# Patient Record
Sex: Male | Born: 1935 | Race: White | Hispanic: No | Smoking: Never smoker
Health system: Southern US, Community
[De-identification: ages and names within clinical notes are randomized; demographics above are authoritative.]

## PROBLEM LIST (undated history)

## (undated) DIAGNOSIS — I10 Essential (primary) hypertension: Secondary | ICD-10-CM

## (undated) HISTORY — PX: TOTAL HIP ARTHROPLASTY: SHX124

---

## 2001-09-27 ENCOUNTER — Encounter: Payer: Self-pay | Admitting: Urology

## 2001-09-27 ENCOUNTER — Ambulatory Visit (HOSPITAL_BASED_OUTPATIENT_CLINIC_OR_DEPARTMENT_OTHER): Admission: RE | Admit: 2001-09-27 | Discharge: 2001-09-27 | Payer: Self-pay | Admitting: Urology

## 2002-07-15 ENCOUNTER — Encounter: Payer: Self-pay | Admitting: Gastroenterology

## 2002-07-15 ENCOUNTER — Encounter: Admission: RE | Admit: 2002-07-15 | Discharge: 2002-07-15 | Payer: Self-pay | Admitting: Gastroenterology

## 2006-03-15 ENCOUNTER — Inpatient Hospital Stay (HOSPITAL_COMMUNITY): Admission: EM | Admit: 2006-03-15 | Discharge: 2006-03-19 | Payer: Self-pay | Admitting: *Deleted

## 2006-03-18 ENCOUNTER — Ambulatory Visit: Payer: Self-pay | Admitting: Physical Medicine & Rehabilitation

## 2006-03-19 ENCOUNTER — Inpatient Hospital Stay (HOSPITAL_COMMUNITY)
Admission: RE | Admit: 2006-03-19 | Discharge: 2006-03-25 | Payer: Self-pay | Admitting: Physical Medicine & Rehabilitation

## 2009-01-28 ENCOUNTER — Emergency Department (HOSPITAL_COMMUNITY): Admission: EM | Admit: 2009-01-28 | Discharge: 2009-01-28 | Payer: Self-pay | Admitting: Emergency Medicine

## 2010-06-07 NOTE — Discharge Summary (Signed)
NAMEDONZEL, ROMACK               ACCOUNT NO.:  [id number]   MEDICAL RECORD NO.:  [id number]          PATIENT TYPE:  IPS   LOCATION:  4001                         FACILITY:  [facility]   PHYSICIAN:  Ellwood Dense, M.D.   DATE OF BIRTH:  May 12, 1931   DATE OF ADMISSION:  03/19/2006  DATE OF DISCHARGE:  03/25/2006                               DISCHARGE SUMMARY   DISCHARGE DIAGNOSES:  1. Right hip basal cervical and subtrochanteric fracture with open      reduction internal fixation of subtrochanteric fracture.  2. Hypertension.  3. Acute blood loss anemia.  4. Hyponatremia resolved.  5. Hyperkalemia resolved.  6. Thrombocytopenia resolved.   HISTORY OF PRESENT ILLNESS:  Mr. Attridge is a 77 year old male with  history of hypertension and fall on February 24, with right hip basal  cervical and subtrochanteric fracture.  Evaluated by Dr. Kennon Rounds, and the  patient underwent right hip IM nailing of subtrochanteric fracture on  February 25.  Postop is touchdown weightbearing and on Coumadin for DVT  prophylaxis.  Therapies initiated and patient noted to have issues with  safety with walker use, is showing improvement with touch down  weightbearing status.  Noted to also have issues with hypokalemia with  potassium at 3.4, hyponatremia with sodium 130, and thrombocytopenia  with platelets at 119 currently.  The patient also with impairments in  mobility and self-care.  Rehab consulted for further therapies.   PAST MEDICAL HISTORY:  Significant for:  1. Hypertension.  2. Lumbar stenosis L4-S1.  3. History of phimosis, benign, treated with circumcision.   ALLERGIES:  NO KNOWN DRUG ALLERGIES.   FAMILY HISTORY:  Noncontributory.   SOCIAL HISTORY:  The patient is married.  Lives with wife and son in a  one-level home with step at entry.  Self-employed in the [employer].  Does not use any tobacco or alcohol use.   HOSPITAL COURSE:  Mr. Cassidy Tabet was admitted to rehab on  March 19, 2006.  The patient therapies to consist of PT and OT today.  Supplement added for his postop anemia.  Recheck CBC of 129 shows  hemoglobin 9.8, hematocrit 27.8, white count 6.9, platelets much  improved at 216.  Check of lytes revealed hyponatremia, hypokalemia did  resolve.  Sodium 136, potassium 3.5, chloride 94, CO2 32, BUN 18,  creatinine 0.91, glucose 112.  Hemoglobin A1c check was noted to be  borderline high at 6.3.  The patient was maintained on Coumadin for DVT  prophylaxis.  anion is therapeutic at discharge at 3.1 and the patient  was discharged on 3 mg of Coumadin a day.  Next pro time to be checked  by home health nurse on March 7, with Coumadin to continue through March  25, to complete DVT prophylaxis.  Pain control has been managed with  p.r.n. use of Ultram.  The patient's right hip incision was noted to be  healing well without any signs or symptoms of infection.  Moderate edema  noted of right thigh is somewhat improved.  Staples were discontinued in  areas and steri-stripped on March 24, 2006,  without difficulty.  The  patient has had issues with constipation and was started on Senna-S with  improvement in his symptomatology.  Therapies initiated and patient has  progressed along to being at min assist for bathing and dressing, min  assist for toileting.  He is at modified independent level for  transfers.  Supervision level for car transfers.  Modified independent  for standing.  Modified independent for ambulating 150 feet, at touch-  down weight bearing.  Supervision to navigate 1 stair.  Further follow-  up therapies to include home health, PT, OT by [facility].  Home health RN to draw protime with [facility] to  manage and adjust Coumadin past discharge.  On March 25, 2006, the  patient is discharged to home.   DISCHARGE MEDICATIONS:  1. Coumadin 2 mg p.o. q.p.m.  2. Senokot-S 2 p.o. nightly.  3. Ferrous sulfate 325 mg  b.i.d.  4. K-Dur 20 mEq a day.  5. Hyzaar 100/25 per day.  6. Ultram 50 mg every 6 to 8 hours p.r.n. pain.  7. Tylenol as needed for pain.   DIET:  Regular.   ACTIVITY:  As tolerated.  Touchdown weightbearing right lower extremity.  Use a walker for ambulation under supervision.   SPECIAL INSTRUCTIONS:  Resume aspirin after April 14, 2006.   FOLLOWUP:  The patient to followup with Dr. Kennon Rounds for postop check in 2  weeks.  Followup with Dr. Sherin Quarry on March 10 at 4:15.  Followup with  Dr. Ellwood Dense as needed.      Greg Cutter, P.A.    ______________________________  Ellwood Dense, M.D.    PP/MEDQ  D:  03/25/2006  T:  03/26/2006  Job:  [id number]   cc:   Marletta Lor, M.D.

## 2010-06-07 NOTE — Discharge Summary (Signed)
[name], Daniel Mathis               ACCOUNT NO.:  [id number]   MEDICAL RECORD NO.:  [id number]          PATIENT TYPE:  INP   LOCATION:  5007                         FACILITY:  [facility]   PHYSICIAN:  Doralee Albino. Carola Frost, M.D. DATE OF BIRTH:  [birth year]   DATE OF ADMISSION:  03/15/2006  DATE OF DISCHARGE:  03/19/2006                               DISCHARGE SUMMARY   DISCHARGE DIAGNOSES:  Right hip basocervical and subtroc femoral  fracture.   ADDITIONAL DIAGNOSIS:  Hypertension.   BRIEF SUMMARY OF HOSPITAL COURSE:  [name] was admitted on  March 15, 2006 and at that time he underwent IM nailing of his right  hip and subtroc fracture using a gamma 3 nail.  He tolerated it quite  well and postoperatively was touchdown weightbearing with physical  therapy.  He was on Coumadin for DVT prophylaxis, he progressed well and  by postop day four was deemed stable for discharge to rehab.  I did feel  that we should go ahead and get a CT scan, as he seemed a little bit  somnolent and I wanted to make sure he did not have an occult subdural,  particularly as he was on Coumadin.  The CT scan was normal as were labs  also obtained to evaluate for possible metabolic reasons for decreased  overall level of consciousness, all this was fine.  The patient also  became more alert as well.   DISCHARGE INSTRUCTIONS:  [name] is to be touchdown weightbearing,  continue Coumadin for DVT prophylaxis, return to see Dr. Carola Frost in two  weeks.  Dressing can be changed daily.      Doralee Albino. Carola Frost, M.D.  Electronically Signed     MHH/MEDQ  D:  05/07/2006  T:  05/07/2006  Job:  [id number]

## 2010-06-07 NOTE — H&P (Signed)
Daniel Mathis, Daniel Mathis               ACCOUNT NO.:  [id number]   MEDICAL RECORD NO.:  [id number]          PATIENT TYPE:  IPS   LOCATION:  NA                           FACILITY:  [facility]   PHYSICIAN:  Ellwood Dense, M.D.   DATE OF BIRTH:  [birth year]   DATE OF ADMISSION:  DATE OF DISCHARGE:                              HISTORY & PHYSICAL   Primary care physician:  Dr. Barnett Abu.  Trauma doctor:  Doralee Albino. Carola Frost, M.D.  Urologist:  Valetta Fuller, M.D.   HISTORY OF PRESENT ILLNESS:  Daniel Mathis is a 77 year old adult male  with history of hypertension.  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  He was evaluated in the emergency room by  Dr. Carola Frost and underwent a right hip intramedullary nailing March 16, 2006.  Postoperatively he was allowed touchdown weightbearing and is on  Coumadin for DVT prophylaxis with an INR of 3.1 today.  Therapies have  been initiated.  The patient requires cues for safety using a walker.  He has been slightly better in therapy today.   The patient was evaluated by the rehabilitation physicians and felt to  be an appropriate candidate for inpatient rehabilitation.   REVIEW OF SYSTEMS:  Positive for edema of the right leg, wound healing,  and weakness.   PAST MEDICAL HISTORY:  1. Hypertension.  2. Lumbar stenosis at L4-S1.  3. History of phimosis/balanitis, treated with circumcision.   FAMILY HISTORY:  Noncontributory.   SOCIAL HISTORY:  The patient lives with his wife and son.  The wife  requires some assistance and the son provides that.  The son has  undergone multiple surgeries of his pancreas for diabetes and  essentially is only able to care for his mother.  He does not work  outside the home.  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  He reports rare alcohol  intake and denies tobacco usage.   FUNCTIONAL HISTORY PRIOR TO ADMISSION:  Independent and working.   ALLERGIES:  No known drug allergies.   MEDICATIONS PRIOR TO ADMISSION:  1. Cozaar/hydrochlorothiazide 125/12.5 mg, questionable one tablet      daily.  2. Aspirin 81 mg daily.   LABORATORY DATA:  Most recent hemoglobin was 9.8 with a hematocrit of  28.1, platelet count of 109,000, and white count 9.4.  Recent sodium was  130, potassium 3.4, chloride 93, CO2 28, BUN 9, and creatinine 0.8 with  glucose of 184.  Chest x-ray March 15, 2006, showed no active disease  with vague density of the left midlung.  Follow-up PA view was  recommended.   PHYSICAL EXAMINATION:  GENERAL:  A well-appearing adult male lying in a  recliner in mild to no acute discomfort.  VITAL SIGNS:  Blood pressure 128/68 with pulse 76, respiratory rate 18  and temperature 97.2.  HEENT:  Normocephalic, atraumatic.  CARDIOVASCULAR:  Regular rate and rhythm, S1, S2, without murmurs.  ABDOMEN:  Soft, nontender, with positive bowel sounds.  LUNGS:  Clear to auscultation bilaterally.  NEUROLOGIC:  Alert and oriented x3.  Cranial nerves II-XII are intact.  Bilateral upper extremity exam showed 5/5 strength throughout.  Bulk and  tone were normal and reflexes were 2+ and symmetric.  Left lower  extremity exam showed 4+ to 5-/5 strength throughout.  Right lower  extremity exam showed well-healing wound of the proximal right hip.   IMPRESSION:  Status post fall with subtrochanteric hip fracture on the  right, status post intramedullary nailing with touchdown weightbearing  allowed.  Coumadin for DVT prophylaxis.   Presently the patient has deficits in ADLs, transfers and ambulation  secondary to the right subtrochanteric/basocervical hip fracture, status  post nailing.   PLAN:  Admit to the rehabilitation unit for daily therapies to include:  1. PT for range of motion, strengthening, bed mobility, transfers, pre-       gait training, gait training, and equipment evaluation.  2. Occupational therapy for range of motion, strengthening, ADLs,      cognitive/perceptual training, splinting and equipment evaluation.  3. Rehab nursing for skin care, wound care, and bowel and bladder      training.  4. Case management to assess home environment and assist with      discharge planning and arrange for appropriate follow-up care.  5. Social worker to assess family and social support, counsel the      patient regarding disability issues, and assist in discharge      planning.  6. Check admission labs including CBC and CMET Friday, March 20, 2006.  7. Coumadin per pharmacy.  8. Monitor hypertension on hydrochlorothiazide 25 mg p.o. daily and      Cozaar 100 mg p.o. daily.  9. Protonix 40 mg p.o. daily.  10.Check hemoglobin A1c with morning labs.  11.Check a PA and lateral chest x-ray to follow up for left midlung      density.  12.Iron sulfate 325 mg p.o. b.i.d. for postoperative anemia.  13.Wean O2 to off using 1-2 L per nasal cannula to keep saturations      greater than or equal to 90%.  14.Potassium chloride 20 mEq p.o. daily and daily.  15.Dulcolax suppository daily p.r.n. and sorbitol 30 mL p.o. q.12h.      p.r.n. for constipation.  16.Oxycodone 5 mg one to two tablets p.o. q.4h. for pain relief.   PROGNOSIS:  Good.   ESTIMATED LENGTH OF STAY:  4-7 days.   GOALS:  Modified independent ADLs, transfers and ambulation.           ______________________________  Ellwood Dense, M.D.     DC/MEDQ  D:  03/19/2006  T:  03/19/2006  Job:  [id number]

## 2010-06-07 NOTE — Op Note (Signed)
   Daniel Mathis, Daniel Mathis                        ACCOUNT NO.:  [id number]   MEDICAL RECORD NO.:  [id number]                   PATIENT TYPE:  AMB   LOCATION:  [facility]                                 FACILITY:  [facility]   PHYSICIAN:  Valetta Fuller, M.D.               DATE OF BIRTH:  01/18/1932   DATE OF PROCEDURE:  09/27/2001  DATE OF DISCHARGE:                                 OPERATIVE REPORT   PREOPERATIVE DIAGNOSES:  1. Severe phimosis.  2. Chronic balanitis.   POSTOPERATIVE DIAGNOSES:  1. Severe phimosis.  2. Chronic balanitis.   PROCEDURE PERFORMED:  Circumcision.   SURGEON:  Valetta Fuller, M.D.   ANESTHESIA:  IV sedation with penile block.   INDICATIONS:  The patient has been unable to retract his foreskin.  On exam,  he had fairly severe phimosis.  It was difficult to assess the glans penis,  but there did appear to be some evidence of chronic balanitis.  We  recommended strong consideration for circumcision.  The patient was informed  of the advantages, disadvantages, and risks of the procedure and appeared to  understand these issues.  Full informed consent was obtained.   TECHNIQUE AND FINDINGS:  The patient was brought to the operating room.  He  had some IV sedation, and we then performed a penile block with a  combination of Marcaine and lidocaine.  Again, we were unable to retract his  foreskin.  A circumferential incision was made behind the glans penis after  prepping and draping the patient in the normal manner.  We then performed a  dorsal slit in order to retract the foreskin.  The patient had a tremendous  amount of some chronic inflammation on the glans penis along with some  squamous epithelial cell debris.  That was all prepped and cleanses.  A  second circumferential incision was then made in the mucosal collar, and the  redundant, thickened, and inflamed foreskin was removed.  The skin edges  were reapproximated with interrupted 4-0 suture, and a very  light pressure  dressing was applied with Vaseline gauze, Bacitracin, and some Coban.  The  patient appeared to tolerate the procedure well.  We noted at the end that  the patient had fairly severe meatal stenosis, and we have been unable to  evaluate his meatus.  He had a small band inferiorly that we performed a  meatotomy on, and this opened up his meatus to a larger degree.                                               Valetta Fuller, M.D.    DSG/MEDQ  D:  09/27/2001  T:  09/27/2001  Job:  [id number]

## 2010-06-07 NOTE — Consult Note (Signed)
Daniel Mathis               ACCOUNT NO.:  [id number]   MEDICAL RECORD NO.:  [id number]          PATIENT TYPE:  INP   LOCATION:  2550                         FACILITY:  [facility]   PHYSICIAN:  Doralee Albino. Carola Frost, M.D. DATE OF BIRTH:  11-18-1931   DATE OF CONSULTATION:  03/15/2006  DATE OF DISCHARGE:                                 CONSULTATION   EMERGENCY ROOM CONSULTATION:   REQUESTING PHYSICIAN:  Trudi Ida. Denton Lank, M.D.   REASON FOR REQUEST:  Right hip pain and deformity.   BRIEF HISTORY OF PRESENTATION:  Daniel Mathis is a 77 year old, white  male who fell approximately 4 feet from a pickup truck sustaining  immediate pain and deformity of the right hip with inability to bear  weight.  He was transported to [facility] ER for further evaluation and  management.  He denied other injury, denied loss of consciousness, and  he has specifically denies any numbness or tingling in the right lower  extremity.  His pain is deep seated and continuous.  Currently it is  adequately controlled with narcotics.   PAST MEDICAL HISTORY:  Notable only for hypertension.   CURRENT MEDICATIONS:  Cozaar.   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  He  does not smoke, rarely drinks alcohol, is married.   FAMILY HISTORY:  Noncontributory to current admission and without  significant pertinent findings.   REVIEW OF SYSTEMS:  Negative for recent chest pain, shortness of breath,  syncope, GI issues, liver problems, diabetes, nausea, vomiting or other  concerns.   PHYSICAL EXAMINATION:  GENERAL:  He is 6 feet tall, weighs 195 pounds.  He is not in any acute distress, appears well-nourished.  LUNGS:  Are clear to auscultation bilaterally.  HEART:  Has a regular rate and rhythm without murmur.  ABDOMEN:  Soft, nontender, nondistended.  EXTREMITIES:  Right lower extremity notable for hip tenderness in the  absence of any knee tenderness or swelling.  He has intact deep  peroneal, superficial peroneal, and tibial nerve sensory and motor  function with motor function being 5/5 distally.  Dorsalis pedis and  posterior tib pulses are each 2+, easily palpable.   X-rays:  Four views were obtained of the right femur, consisting of AP  and lateral, proximally and distally, these demonstrate a basal cervical  femoral neck fracture and a subtrochanteric fracture with extension down  in the proximal third of the shaft.   LABORATORY STUDIES:  PTT is pending, but INR within normal limits.  H&H  notable for 14.6 and 42.  Chemistry all entirely within normal limits.   ASSESSMENT:  Right basal cervical and proximal femur fracture.   PLAN:  Intramedullary nailing of the right hip using a Gamma nail device  which has been scheduled for 12 noon tomorrow.  Patient will be n.p.o.  after midnight.  I have discussed with the patient, his brother and  friend in detail the risks and benefits of surgery including the  possibility of heart attack, stroke, infection, DVT, PE, malunion,  nonunion, the need for further surgery, neurovascular injury and others.  We have also discussed the postoperative rehabilitation plan.  After  full discussion the patient would like to proceed.      Doralee Albino. Carola Frost, M.D.  Electronically Signed     MHH/MEDQ  D:  03/15/2006  T:  03/15/2006  Job:  [id number]

## 2010-06-07 NOTE — Op Note (Signed)
Daniel Mathis, Daniel Mathis               ACCOUNT NO.:  [id number]   MEDICAL RECORD NO.:  [id number]          PATIENT TYPE:  INP   LOCATION:  5007                         FACILITY:  [facility]   PHYSICIAN:  Doralee Albino. Carola Frost, M.D. DATE OF BIRTH:  [birth year]   DATE OF PROCEDURE:  03/16/2006  DATE OF DISCHARGE:                               OPERATIVE REPORT   PREOPERATIVE DIAGNOSES:  1. Right hip basocervical fracture.  2. Subtrochanteric fracture of the femoral shaft.   POSTOPERATIVE DIAGNOSES:  1. Right hip basocervical fracture.  2. Subtrochanteric fracture of the femoral shaft.   PROCEDURE:  Intramedullary nailing of the right hip, basocervical and  subtrochanteric fracture using gamma nail with a 380-mm length, 125-  degree lag screw into the femoral head.   SURGEON:  Doralee Albino. Carola Frost, M.D.   ASSISTANT:  None.   ANESTHESIA:  General.   COMPLICATIONS:  None.   ESTIMATED BLOOD LOSS:  200 mL with reaming.   COMPLICATIONS:  None.   DISPOSITION:  To PACU.   CONDITION:  Stable.   BRIEF SUMMARY OF INDICATION FOR PROCEDURE:  Daniel Mathis is a 77-year-  old male who sustained a right hip fracture in a fall from his truck.  We discussed with him the risks and benefits of surgery including the  risk of malunion, nonunion, loss of reduction, heart attack, stroke,  DVT, PE, infection, neurovascular injury and others.  After full  discussion, the patient wished to proceed.   BRIEF SUMMARY OF PROCEDURE:  Daniel Mathis was taken to the operating  room, where general anesthesia was induced.  He was then positioned on  the fracture table.  His right hip was prepped and draped in the usual  sterile fashion.  We were careful to match the rotation of the fracture  in both the femoral neck and down in the femoral shaft.  Once we had  obtained a closed reduction, we then prepped the right lower extremity  in standard sterile fashion.  We made a 3-cm proximal incision after  identifying it with  an instrument on C-arm to make sure we were in the  proper trajectory on both the AP and lateral planes.  We then used a  curved cannulated awl and the guide to establish a correct starting  point; this was very difficult, given the multiple fractures in the  peritrochanteric area.  We did not wish to displace his neck fragment  and we were very careful again to maintain appropriate angulation of the  neck fragment.  We passed a guidewire in the center/center position of  the distal femur after using the starting reamer proximally.  We then  measured it at 38 cm; it was sequentially reamed distally to 13 mm.  The  nail was inserted and then the a guidewire into the center/center  position of the femoral head; this was measured, and a 100-mm screw was  placed.  Insertion of the nail did seem to alter reduction just  slightly, which was anatomic up to that point.  We appeared to have just  a little bit of distraction which could be  corrected with the  compression device.  We inserted the screw and then used the anti-  rotation screw to engage the channel and checked the handle to make sure  it had done so.  It was then backed up 1/4 turn and compression applied,  resulting in closure of the fracture site.  The end of the lag screw  could be felt protruding out from the femoral cortex.  This resulted in  an overall excellent alignment and apposition on the AP and lateral  projections.  Two distal screws were then placed using perfect circle  technique.  The wounds were copiously irrigated and closed in standard  layered fashion with the #1 Vicryl for the tensor layer, 2-0 for the  shallow subcu and staples for the skin.  Sterile gently compressive  dressings were applied.  Before the patient was awakened from anesthesia  and while still anesthetized and on the bed, his legs were checked for  rotation and appeared to be completely symmetric to clinical  examination.  The patient was then  awakened from anesthesia and  transported to the PACU in stable condition.   PROGNOSIS:  Daniel Mathis should do well if he can maintain  nonweightbearing on the right lower extremity or touchdown weightbearing  over the next 6 weeks.  He will be on DVT prophylaxis with Coumadin and  we will work with physical and occupational therapy.      Doralee Albino. Carola Frost, M.D.  Electronically Signed     MHH/MEDQ  D:  03/16/2006  T:  03/17/2006  Job:  [id number]

## 2010-08-22 ENCOUNTER — Ambulatory Visit (INDEPENDENT_AMBULATORY_CARE_PROVIDER_SITE_OTHER): Payer: Medicare HMO | Admitting: Otolaryngology

## 2010-08-22 DIAGNOSIS — H6120 Impacted cerumen, unspecified ear: Secondary | ICD-10-CM

## 2011-08-21 ENCOUNTER — Ambulatory Visit (INDEPENDENT_AMBULATORY_CARE_PROVIDER_SITE_OTHER): Payer: Medicare HMO | Admitting: Otolaryngology

## 2014-12-11 DIAGNOSIS — E119 Type 2 diabetes mellitus without complications: Secondary | ICD-10-CM | POA: Diagnosis not present

## 2014-12-18 DIAGNOSIS — E119 Type 2 diabetes mellitus without complications: Secondary | ICD-10-CM | POA: Diagnosis not present

## 2014-12-18 DIAGNOSIS — I10 Essential (primary) hypertension: Secondary | ICD-10-CM | POA: Diagnosis not present

## 2014-12-18 DIAGNOSIS — Z23 Encounter for immunization: Secondary | ICD-10-CM | POA: Diagnosis not present

## 2015-04-11 DIAGNOSIS — I10 Essential (primary) hypertension: Secondary | ICD-10-CM | POA: Diagnosis not present

## 2015-04-11 DIAGNOSIS — E119 Type 2 diabetes mellitus without complications: Secondary | ICD-10-CM | POA: Diagnosis not present

## 2015-04-11 DIAGNOSIS — Z79899 Other long term (current) drug therapy: Secondary | ICD-10-CM | POA: Diagnosis not present

## 2015-05-02 DIAGNOSIS — E119 Type 2 diabetes mellitus without complications: Secondary | ICD-10-CM | POA: Diagnosis not present

## 2015-05-02 DIAGNOSIS — I10 Essential (primary) hypertension: Secondary | ICD-10-CM | POA: Diagnosis not present

## 2015-05-02 DIAGNOSIS — Z6828 Body mass index (BMI) 28.0-28.9, adult: Secondary | ICD-10-CM | POA: Diagnosis not present

## 2015-05-02 DIAGNOSIS — Z23 Encounter for immunization: Secondary | ICD-10-CM | POA: Diagnosis not present

## 2015-08-29 DIAGNOSIS — E119 Type 2 diabetes mellitus without complications: Secondary | ICD-10-CM | POA: Diagnosis not present

## 2015-09-05 DIAGNOSIS — E119 Type 2 diabetes mellitus without complications: Secondary | ICD-10-CM | POA: Diagnosis not present

## 2015-09-05 DIAGNOSIS — I10 Essential (primary) hypertension: Secondary | ICD-10-CM | POA: Diagnosis not present

## 2016-01-09 DIAGNOSIS — Z79899 Other long term (current) drug therapy: Secondary | ICD-10-CM | POA: Diagnosis not present

## 2016-01-09 DIAGNOSIS — E119 Type 2 diabetes mellitus without complications: Secondary | ICD-10-CM | POA: Diagnosis not present

## 2016-01-17 DIAGNOSIS — Z23 Encounter for immunization: Secondary | ICD-10-CM | POA: Diagnosis not present

## 2016-01-17 DIAGNOSIS — I10 Essential (primary) hypertension: Secondary | ICD-10-CM | POA: Diagnosis not present

## 2016-01-17 DIAGNOSIS — L858 Other specified epidermal thickening: Secondary | ICD-10-CM | POA: Diagnosis not present

## 2016-01-17 DIAGNOSIS — E119 Type 2 diabetes mellitus without complications: Secondary | ICD-10-CM | POA: Diagnosis not present

## 2016-02-20 DIAGNOSIS — D0422 Carcinoma in situ of skin of left ear and external auricular canal: Secondary | ICD-10-CM | POA: Diagnosis not present

## 2016-03-25 DIAGNOSIS — Z08 Encounter for follow-up examination after completed treatment for malignant neoplasm: Secondary | ICD-10-CM | POA: Diagnosis not present

## 2016-03-25 DIAGNOSIS — L570 Actinic keratosis: Secondary | ICD-10-CM | POA: Diagnosis not present

## 2016-03-25 DIAGNOSIS — X32XXXD Exposure to sunlight, subsequent encounter: Secondary | ICD-10-CM | POA: Diagnosis not present

## 2016-03-25 DIAGNOSIS — Z85828 Personal history of other malignant neoplasm of skin: Secondary | ICD-10-CM | POA: Diagnosis not present

## 2016-06-13 DIAGNOSIS — E119 Type 2 diabetes mellitus without complications: Secondary | ICD-10-CM | POA: Diagnosis not present

## 2016-06-24 DIAGNOSIS — E119 Type 2 diabetes mellitus without complications: Secondary | ICD-10-CM | POA: Diagnosis not present

## 2016-06-24 DIAGNOSIS — I10 Essential (primary) hypertension: Secondary | ICD-10-CM | POA: Diagnosis not present

## 2016-08-06 DIAGNOSIS — Z6827 Body mass index (BMI) 27.0-27.9, adult: Secondary | ICD-10-CM | POA: Diagnosis not present

## 2016-08-06 DIAGNOSIS — Z0000 Encounter for general adult medical examination without abnormal findings: Secondary | ICD-10-CM | POA: Diagnosis not present

## 2016-08-06 DIAGNOSIS — I10 Essential (primary) hypertension: Secondary | ICD-10-CM | POA: Diagnosis not present

## 2016-08-06 DIAGNOSIS — Z972 Presence of dental prosthetic device (complete) (partial): Secondary | ICD-10-CM | POA: Diagnosis not present

## 2016-11-03 ENCOUNTER — Ambulatory Visit (INDEPENDENT_AMBULATORY_CARE_PROVIDER_SITE_OTHER): Payer: Medicare HMO | Admitting: Otolaryngology

## 2016-11-03 DIAGNOSIS — H6123 Impacted cerumen, bilateral: Secondary | ICD-10-CM

## 2016-11-03 DIAGNOSIS — H900 Conductive hearing loss, bilateral: Secondary | ICD-10-CM

## 2016-12-17 DIAGNOSIS — I10 Essential (primary) hypertension: Secondary | ICD-10-CM | POA: Diagnosis not present

## 2016-12-17 DIAGNOSIS — E119 Type 2 diabetes mellitus without complications: Secondary | ICD-10-CM | POA: Diagnosis not present

## 2016-12-17 DIAGNOSIS — N529 Male erectile dysfunction, unspecified: Secondary | ICD-10-CM | POA: Diagnosis not present

## 2016-12-17 DIAGNOSIS — Z79899 Other long term (current) drug therapy: Secondary | ICD-10-CM | POA: Diagnosis not present

## 2016-12-24 DIAGNOSIS — Z23 Encounter for immunization: Secondary | ICD-10-CM | POA: Diagnosis not present

## 2016-12-24 DIAGNOSIS — E119 Type 2 diabetes mellitus without complications: Secondary | ICD-10-CM | POA: Diagnosis not present

## 2016-12-24 DIAGNOSIS — I10 Essential (primary) hypertension: Secondary | ICD-10-CM | POA: Diagnosis not present

## 2017-01-07 DIAGNOSIS — I10 Essential (primary) hypertension: Secondary | ICD-10-CM | POA: Diagnosis not present

## 2017-02-09 DIAGNOSIS — Z6827 Body mass index (BMI) 27.0-27.9, adult: Secondary | ICD-10-CM | POA: Diagnosis not present

## 2017-02-09 DIAGNOSIS — I10 Essential (primary) hypertension: Secondary | ICD-10-CM | POA: Diagnosis not present

## 2017-03-16 DIAGNOSIS — I10 Essential (primary) hypertension: Secondary | ICD-10-CM | POA: Diagnosis not present

## 2017-07-01 DIAGNOSIS — N529 Male erectile dysfunction, unspecified: Secondary | ICD-10-CM | POA: Diagnosis not present

## 2017-07-01 DIAGNOSIS — I10 Essential (primary) hypertension: Secondary | ICD-10-CM | POA: Diagnosis not present

## 2017-07-01 DIAGNOSIS — Z79899 Other long term (current) drug therapy: Secondary | ICD-10-CM | POA: Diagnosis not present

## 2017-07-01 DIAGNOSIS — E119 Type 2 diabetes mellitus without complications: Secondary | ICD-10-CM | POA: Diagnosis not present

## 2017-07-03 DIAGNOSIS — E119 Type 2 diabetes mellitus without complications: Secondary | ICD-10-CM | POA: Diagnosis not present

## 2017-07-03 DIAGNOSIS — I10 Essential (primary) hypertension: Secondary | ICD-10-CM | POA: Diagnosis not present

## 2017-07-24 ENCOUNTER — Emergency Department (HOSPITAL_COMMUNITY)
Admission: EM | Admit: 2017-07-24 | Discharge: 2017-07-24 | Disposition: A | Discharge status: Home / Self Care | Payer: Medicare HMO | Attending: Emergency Medicine | Admitting: Emergency Medicine

## 2017-07-24 ENCOUNTER — Other Ambulatory Visit: Payer: Self-pay

## 2017-07-24 ENCOUNTER — Encounter (HOSPITAL_COMMUNITY): Payer: Self-pay

## 2017-07-24 DIAGNOSIS — K5900 Constipation, unspecified: Secondary | ICD-10-CM | POA: Diagnosis not present

## 2017-07-24 DIAGNOSIS — I10 Essential (primary) hypertension: Secondary | ICD-10-CM | POA: Insufficient documentation

## 2017-07-24 HISTORY — DX: Essential (primary) hypertension: I10

## 2017-07-24 MED ORDER — BISACODYL 10 MG RE SUPP
RECTAL | Status: AC
  Filled 2017-07-24: qty 1

## 2017-07-24 MED ORDER — BISACODYL 10 MG RE SUPP
10.0000 mg | Freq: Once | RECTAL | Status: AC
  Administered 2017-07-24: 10 mg via RECTAL

## 2017-07-24 NOTE — ED Triage Notes (Signed)
Pt reports that he hasnt had a BM for 2 days and when he tries stool is very hard and occasional watery stool. Also reports that he has been unable to urinate since 6 am

## 2017-07-24 NOTE — Discharge Instructions (Addendum)
To help the constipation eat a high-fiber diet.  Also drink 1 to 2 L of water each day.  To treat constipation use MiraLAX twice a day, until you have regular formed bowel movements for 3 days in a row, then take it once a day for 1 month.

## 2017-07-24 NOTE — ED Provider Notes (Signed)
[facility] EMERGENCY DEPARTMENT Provider Note   CSN: [id number] Arrival date & time: 07/24/17  4081     History   Chief Complaint Chief Complaint  Patient presents with  . Constipation  . Urinary Retention    HPI Daniel Mathis is a 84 y.o. male.  HPI   He presents for evaluation of no stool for 2 days and sensation of constipation.  He feels like he has to go but is only able to pass a small amount of thin brown liquid.  He denies abdominal pain, vomiting, anorexia, fever, chills, back pain or dizziness.  No history of chronic constipation.  There are no other no modifying factors.  Past Medical History:  Diagnosis Date  . Hypertension     There are no active problems to display for this patient.   History reviewed. No pertinent surgical history.      Home Medications    Prior to Admission medications   Medication Sig Start Date End Date Taking? Authorizing Provider  amLODipine (NORVASC) 2.5 MG tablet Take 1 tablet by mouth daily. 07/17/17  Yes [provider]  amoxicillin (AMOXIL) 500 MG capsule Take 1 capsule by mouth 3 (three) times daily. 07/21/17  Yes [provider]    Family History No family history on file.  Social History Social History   Tobacco Use  . Smoking status: Never Smoker  Substance Use Topics  . Alcohol use: Never    Frequency: Never  . Drug use: Never     Allergies   Patient has no known allergies.   Review of Systems Review of Systems  All other systems reviewed and are negative.    Physical Exam Updated Vital Signs BP (!) 152/69 (BP Location: Right Arm)   Pulse 99   Resp 18   Ht 6' (1.829 m)   Wt 90.7 kg (200 lb)   SpO2 97%   BMI 27.12 kg/m   Physical Exam  Constitutional: He is oriented to person, place, and time. He appears well-developed and well-nourished. No distress.  HENT:  Head: Normocephalic and atraumatic.  Right Ear: External ear normal.  Left Ear: External ear normal.  Eyes:  Pupils are equal, round, and reactive to light. Conjunctivae and EOM are normal.  Neck: Normal range of motion and phonation normal. Neck supple.  Cardiovascular: Normal rate, regular rhythm and normal heart sounds.  Pulmonary/Chest: Effort normal and breath sounds normal. No respiratory distress. He exhibits no bony tenderness.  Abdominal: Soft. There is no tenderness.  Genitourinary:  Genitourinary Comments: Pain is with leakage of thin brown stool.  Fecal impaction is present, disimpaction, by me during exam.  Musculoskeletal: Normal range of motion.  Neurological: He is alert and oriented to person, place, and time. No cranial nerve deficit or sensory deficit. He exhibits normal muscle tone. Coordination normal.  Skin: Skin is warm, dry and intact.  Psychiatric: He has a normal mood and affect. His behavior is normal. Judgment and thought content normal.  Nursing note and vitals reviewed.    ED Treatments / Results  Labs (all labs ordered are listed, but only abnormal results are displayed) Labs Reviewed - No data to display  EKG None  Radiology No results found.  Procedures Fecal disimpaction Date/Time: 07/24/2017 9:22 AM Performed by: Daleen Bo, MD Authorized by: Daleen Bo, MD  Consent: Verbal consent obtained. Risks and benefits: risks, benefits and alternatives were discussed Consent given by: patient Patient consent: the patient's understanding of the procedure matches consent given Patient  identity confirmed: verbally with patient Time out: Immediately prior to procedure a "time out" was called to verify the correct patient, procedure, equipment, support staff and site/side marked as required. Local anesthesia used: no  Anesthesia: Local anesthesia used: no  Sedation: Patient sedated: no  Patient tolerance: Patient tolerated the procedure well with no immediate complications    (including critical care time)  Medications Ordered in ED Medications    bisacodyl (DULCOLAX) 10 MG suppository (has no administration in time range)  bisacodyl (DULCOLAX) suppository 10 mg (10 mg Rectal Given 07/24/17 0845)     Initial Impression / Assessment and Plan / ED Course  I have reviewed the triage vital signs and the nursing notes.  Pertinent labs & imaging results that were available during my care of the patient were reviewed by me and considered in my medical decision making (see chart for details).  Clinical Course as of Jul 24 1024  Fri Jul 24, 2017  1024 Patient reports he has had a moderate sized normally formed bowel movement, here.   [EW]    Clinical Course User Index [EW] Daleen Bo, MD     Patient Vitals for the past 24 hrs:  BP Pulse Resp SpO2 Height Weight  07/24/17 0830 (!) 152/69 99 18 97 % - -  07/24/17 0828 - - - - 6' (1.829 m) 90.7 kg (200 lb)    10:24 AM Reevaluation with update and discussion. After initial assessment and treatment, an updated evaluation reveals patient is comfortable and has no further complaints.  He has stooled in the ED.  Findings discussed and questions answered. Daleen Bo   Medical Decision Making: Constipation, likely multifactorial.  No evidence for serious bacterial infection or metabolic instability.  CRITICAL CARE-no Performed by: Daleen Bo   Nursing Notes Reviewed/ Care Coordinated Applicable Imaging Reviewed Interpretation of Laboratory Data incorporated into ED treatment  The patient appears reasonably screened and/or stabilized for discharge and I doubt any other medical condition or other [facility] requiring further screening, evaluation, or treatment in the ED at this time prior to discharge.  Plan: Home Medications-continue usual medications; Home Treatments-increase fiber and fluid in diet; return here if the recommended treatment, does not improve the symptoms; Recommended follow up-PCP checkup 1 week.     Final Clinical Impressions(s) / ED Diagnoses   Final diagnoses:   Constipation, unspecified constipation type    ED Discharge Orders    None       Daleen Bo, MD 07/24/17 1026

## 2017-11-23 ENCOUNTER — Ambulatory Visit (INDEPENDENT_AMBULATORY_CARE_PROVIDER_SITE_OTHER): Payer: Medicare HMO | Admitting: Otolaryngology

## 2017-11-23 DIAGNOSIS — H6123 Impacted cerumen, bilateral: Secondary | ICD-10-CM

## 2017-12-21 DIAGNOSIS — Z79899 Other long term (current) drug therapy: Secondary | ICD-10-CM | POA: Diagnosis not present

## 2017-12-21 DIAGNOSIS — E119 Type 2 diabetes mellitus without complications: Secondary | ICD-10-CM | POA: Diagnosis not present

## 2017-12-28 DIAGNOSIS — I10 Essential (primary) hypertension: Secondary | ICD-10-CM | POA: Diagnosis not present

## 2017-12-28 DIAGNOSIS — E1159 Type 2 diabetes mellitus with other circulatory complications: Secondary | ICD-10-CM | POA: Diagnosis not present

## 2017-12-28 DIAGNOSIS — Z23 Encounter for immunization: Secondary | ICD-10-CM | POA: Diagnosis not present

## 2018-01-03 ENCOUNTER — Other Ambulatory Visit: Payer: Self-pay

## 2018-01-03 ENCOUNTER — Encounter (HOSPITAL_COMMUNITY): Payer: Self-pay

## 2018-01-03 ENCOUNTER — Emergency Department (HOSPITAL_COMMUNITY)
Admission: EM | Admit: 2018-01-03 | Discharge: 2018-01-03 | Disposition: A | Discharge status: Home / Self Care | Payer: Medicare HMO | Attending: Emergency Medicine | Admitting: Emergency Medicine

## 2018-01-03 ENCOUNTER — Emergency Department (HOSPITAL_COMMUNITY): Payer: Medicare HMO

## 2018-01-03 DIAGNOSIS — S79911A Unspecified injury of right hip, initial encounter: Secondary | ICD-10-CM | POA: Diagnosis not present

## 2018-01-03 DIAGNOSIS — S3992XA Unspecified injury of lower back, initial encounter: Secondary | ICD-10-CM | POA: Diagnosis not present

## 2018-01-03 DIAGNOSIS — I10 Essential (primary) hypertension: Secondary | ICD-10-CM | POA: Insufficient documentation

## 2018-01-03 DIAGNOSIS — Z79899 Other long term (current) drug therapy: Secondary | ICD-10-CM | POA: Insufficient documentation

## 2018-01-03 DIAGNOSIS — M25551 Pain in right hip: Secondary | ICD-10-CM | POA: Insufficient documentation

## 2018-01-03 DIAGNOSIS — M545 Low back pain: Secondary | ICD-10-CM | POA: Diagnosis not present

## 2018-01-03 MED ORDER — METHOCARBAMOL 500 MG PO TABS: 500.0000 mg | ORAL_TABLET | Freq: Two times a day (BID) | ORAL | 0 refills | Status: DC | PRN

## 2018-01-03 MED ORDER — ACETAMINOPHEN 325 MG PO TABS
650.0000 mg | ORAL_TABLET | Freq: Once | ORAL | Status: AC
  Administered 2018-01-03: 650 mg via ORAL
  Filled 2018-01-03: qty 2

## 2018-01-03 NOTE — Discharge Instructions (Signed)
Take the prescription as directed. Take over the counter tylenol, as directed on packaging, as needed for discomfort. Apply moist heat or ice to the area(s) of discomfort, for 15 minutes at a time, several times per day for the next few days.  Do not fall asleep on a heating or ice pack.  Call your regular medical doctor and your Orthopedic doctor tomorrow to schedule a follow up appointment this week.  Return to the Emergency Department immediately if worsening.
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
## 2018-04-05 DIAGNOSIS — C44629 Squamous cell carcinoma of skin of left upper limb, including shoulder: Secondary | ICD-10-CM | POA: Diagnosis not present

## 2018-04-05 DIAGNOSIS — Z08 Encounter for follow-up examination after completed treatment for malignant neoplasm: Secondary | ICD-10-CM | POA: Diagnosis not present

## 2018-04-05 DIAGNOSIS — L570 Actinic keratosis: Secondary | ICD-10-CM | POA: Diagnosis not present

## 2018-04-05 DIAGNOSIS — Z85828 Personal history of other malignant neoplasm of skin: Secondary | ICD-10-CM | POA: Diagnosis not present

## 2018-04-05 DIAGNOSIS — X32XXXD Exposure to sunlight, subsequent encounter: Secondary | ICD-10-CM | POA: Diagnosis not present

## 2018-05-25 DIAGNOSIS — X32XXXD Exposure to sunlight, subsequent encounter: Secondary | ICD-10-CM | POA: Diagnosis not present

## 2018-05-25 DIAGNOSIS — Z85828 Personal history of other malignant neoplasm of skin: Secondary | ICD-10-CM | POA: Diagnosis not present

## 2018-05-25 DIAGNOSIS — Z08 Encounter for follow-up examination after completed treatment for malignant neoplasm: Secondary | ICD-10-CM | POA: Diagnosis not present

## 2018-05-25 DIAGNOSIS — L570 Actinic keratosis: Secondary | ICD-10-CM | POA: Diagnosis not present

## 2018-06-10 ENCOUNTER — Emergency Department (HOSPITAL_COMMUNITY)
Admission: EM | Admit: 2018-06-10 | Discharge: 2018-06-10 | Disposition: A | Discharge status: Home / Self Care | Payer: Medicare HMO | Attending: Emergency Medicine | Admitting: Emergency Medicine

## 2018-06-10 ENCOUNTER — Encounter (HOSPITAL_COMMUNITY): Payer: Self-pay | Admitting: Emergency Medicine

## 2018-06-10 ENCOUNTER — Other Ambulatory Visit: Payer: Self-pay

## 2018-06-10 DIAGNOSIS — Z5321 Procedure and treatment not carried out due to patient leaving prior to being seen by health care provider: Secondary | ICD-10-CM | POA: Insufficient documentation

## 2018-06-10 DIAGNOSIS — R339 Retention of urine, unspecified: Secondary | ICD-10-CM | POA: Insufficient documentation

## 2018-06-10 NOTE — ED Triage Notes (Signed)
Patient report not being able to urinate since early this am. Has not had BM in 2 days.

## 2018-06-21 DIAGNOSIS — Z79899 Other long term (current) drug therapy: Secondary | ICD-10-CM | POA: Diagnosis not present

## 2018-06-21 DIAGNOSIS — I10 Essential (primary) hypertension: Secondary | ICD-10-CM | POA: Diagnosis not present

## 2018-06-21 DIAGNOSIS — E1159 Type 2 diabetes mellitus with other circulatory complications: Secondary | ICD-10-CM | POA: Diagnosis not present

## 2018-06-28 DIAGNOSIS — I700 Atherosclerosis of aorta: Secondary | ICD-10-CM | POA: Diagnosis not present

## 2018-06-28 DIAGNOSIS — E1159 Type 2 diabetes mellitus with other circulatory complications: Secondary | ICD-10-CM | POA: Diagnosis not present

## 2018-06-28 DIAGNOSIS — I10 Essential (primary) hypertension: Secondary | ICD-10-CM | POA: Diagnosis not present

## 2018-08-02 ENCOUNTER — Other Ambulatory Visit: Payer: Medicare HMO

## 2018-08-02 ENCOUNTER — Other Ambulatory Visit: Payer: Self-pay

## 2018-08-02 DIAGNOSIS — R6889 Other general symptoms and signs: Secondary | ICD-10-CM | POA: Diagnosis not present

## 2018-08-02 DIAGNOSIS — Z20822 Contact with and (suspected) exposure to covid-19: Secondary | ICD-10-CM

## 2018-08-04 DIAGNOSIS — M5416 Radiculopathy, lumbar region: Secondary | ICD-10-CM | POA: Diagnosis not present

## 2018-08-05 LAB — NOVEL CORONAVIRUS, NAA: SARS-CoV-2, NAA: NOT DETECTED

## 2018-09-07 ENCOUNTER — Other Ambulatory Visit: Payer: Self-pay

## 2018-09-07 DIAGNOSIS — Z20822 Contact with and (suspected) exposure to covid-19: Secondary | ICD-10-CM

## 2018-09-08 LAB — NOVEL CORONAVIRUS, NAA: SARS-CoV-2, NAA: NOT DETECTED

## 2018-11-22 ENCOUNTER — Ambulatory Visit (INDEPENDENT_AMBULATORY_CARE_PROVIDER_SITE_OTHER): Payer: Medicare HMO | Admitting: Otolaryngology

## 2018-11-22 DIAGNOSIS — H6123 Impacted cerumen, bilateral: Secondary | ICD-10-CM

## 2018-12-14 ENCOUNTER — Other Ambulatory Visit: Payer: Self-pay

## 2018-12-14 DIAGNOSIS — Z20822 Contact with and (suspected) exposure to covid-19: Secondary | ICD-10-CM

## 2018-12-15 LAB — NOVEL CORONAVIRUS, NAA: SARS-CoV-2, NAA: NOT DETECTED

## 2018-12-20 DIAGNOSIS — E1159 Type 2 diabetes mellitus with other circulatory complications: Secondary | ICD-10-CM | POA: Diagnosis not present

## 2018-12-29 DIAGNOSIS — E1159 Type 2 diabetes mellitus with other circulatory complications: Secondary | ICD-10-CM | POA: Diagnosis not present

## 2018-12-29 DIAGNOSIS — Z23 Encounter for immunization: Secondary | ICD-10-CM | POA: Diagnosis not present

## 2018-12-29 DIAGNOSIS — Z20828 Contact with and (suspected) exposure to other viral communicable diseases: Secondary | ICD-10-CM | POA: Diagnosis not present

## 2018-12-30 ENCOUNTER — Other Ambulatory Visit: Payer: Self-pay

## 2018-12-30 DIAGNOSIS — Z20822 Contact with and (suspected) exposure to covid-19: Secondary | ICD-10-CM

## 2018-12-31 LAB — NOVEL CORONAVIRUS, NAA: SARS-CoV-2, NAA: DETECTED — AB

## 2019-01-17 ENCOUNTER — Ambulatory Visit: Payer: Medicare HMO | Attending: Internal Medicine

## 2019-01-17 ENCOUNTER — Other Ambulatory Visit: Payer: Self-pay

## 2019-01-17 DIAGNOSIS — Z20822 Contact with and (suspected) exposure to covid-19: Secondary | ICD-10-CM

## 2019-01-17 DIAGNOSIS — Z20828 Contact with and (suspected) exposure to other viral communicable diseases: Secondary | ICD-10-CM | POA: Diagnosis not present

## 2019-01-18 LAB — NOVEL CORONAVIRUS, NAA: SARS-CoV-2, NAA: NOT DETECTED

## 2019-01-19 ENCOUNTER — Telehealth: Payer: Self-pay

## 2019-01-19 NOTE — Telephone Encounter (Signed)
Pt. Given COVID 19 results, verbalizes understanding. 

## 2019-04-25 DIAGNOSIS — E1159 Type 2 diabetes mellitus with other circulatory complications: Secondary | ICD-10-CM | POA: Diagnosis not present

## 2019-04-29 DIAGNOSIS — I10 Essential (primary) hypertension: Secondary | ICD-10-CM | POA: Diagnosis not present

## 2019-04-29 DIAGNOSIS — E1159 Type 2 diabetes mellitus with other circulatory complications: Secondary | ICD-10-CM | POA: Diagnosis not present

## 2019-08-17 DIAGNOSIS — Z79899 Other long term (current) drug therapy: Secondary | ICD-10-CM | POA: Diagnosis not present

## 2019-08-17 DIAGNOSIS — E1159 Type 2 diabetes mellitus with other circulatory complications: Secondary | ICD-10-CM | POA: Diagnosis not present

## 2019-08-17 DIAGNOSIS — I10 Essential (primary) hypertension: Secondary | ICD-10-CM | POA: Diagnosis not present

## 2019-08-24 DIAGNOSIS — I10 Essential (primary) hypertension: Secondary | ICD-10-CM | POA: Diagnosis not present

## 2019-08-24 DIAGNOSIS — E1159 Type 2 diabetes mellitus with other circulatory complications: Secondary | ICD-10-CM | POA: Diagnosis not present

## 2019-11-02 DIAGNOSIS — X32XXXD Exposure to sunlight, subsequent encounter: Secondary | ICD-10-CM | POA: Diagnosis not present

## 2019-11-02 DIAGNOSIS — Z08 Encounter for follow-up examination after completed treatment for malignant neoplasm: Secondary | ICD-10-CM | POA: Diagnosis not present

## 2019-11-02 DIAGNOSIS — L570 Actinic keratosis: Secondary | ICD-10-CM | POA: Diagnosis not present

## 2019-11-02 DIAGNOSIS — Z85828 Personal history of other malignant neoplasm of skin: Secondary | ICD-10-CM | POA: Diagnosis not present

## 2019-12-21 DIAGNOSIS — E1159 Type 2 diabetes mellitus with other circulatory complications: Secondary | ICD-10-CM | POA: Diagnosis not present

## 2019-12-28 DIAGNOSIS — R7309 Other abnormal glucose: Secondary | ICD-10-CM | POA: Diagnosis not present

## 2019-12-28 DIAGNOSIS — E1100 Type 2 diabetes mellitus with hyperosmolarity without nonketotic hyperglycemic-hyperosmolar coma (NKHHC): Secondary | ICD-10-CM | POA: Diagnosis not present

## 2019-12-28 DIAGNOSIS — Z23 Encounter for immunization: Secondary | ICD-10-CM | POA: Diagnosis not present

## 2019-12-28 DIAGNOSIS — I10 Essential (primary) hypertension: Secondary | ICD-10-CM | POA: Diagnosis not present

## 2020-04-20 DIAGNOSIS — E1159 Type 2 diabetes mellitus with other circulatory complications: Secondary | ICD-10-CM | POA: Diagnosis not present

## 2020-04-24 DIAGNOSIS — H43813 Vitreous degeneration, bilateral: Secondary | ICD-10-CM | POA: Diagnosis not present

## 2020-04-27 DIAGNOSIS — E1100 Type 2 diabetes mellitus with hyperosmolarity without nonketotic hyperglycemic-hyperosmolar coma (NKHHC): Secondary | ICD-10-CM | POA: Diagnosis not present

## 2020-04-27 DIAGNOSIS — I10 Essential (primary) hypertension: Secondary | ICD-10-CM | POA: Diagnosis not present

## 2020-04-27 DIAGNOSIS — R7309 Other abnormal glucose: Secondary | ICD-10-CM | POA: Diagnosis not present

## 2020-05-02 DIAGNOSIS — Z85828 Personal history of other malignant neoplasm of skin: Secondary | ICD-10-CM | POA: Diagnosis not present

## 2020-05-02 DIAGNOSIS — Z08 Encounter for follow-up examination after completed treatment for malignant neoplasm: Secondary | ICD-10-CM | POA: Diagnosis not present

## 2020-05-02 DIAGNOSIS — B078 Other viral warts: Secondary | ICD-10-CM | POA: Diagnosis not present

## 2020-05-02 DIAGNOSIS — X32XXXD Exposure to sunlight, subsequent encounter: Secondary | ICD-10-CM | POA: Diagnosis not present

## 2020-05-02 DIAGNOSIS — L570 Actinic keratosis: Secondary | ICD-10-CM | POA: Diagnosis not present

## 2020-06-29 ENCOUNTER — Other Ambulatory Visit: Payer: Self-pay

## 2020-06-29 ENCOUNTER — Other Ambulatory Visit (HOSPITAL_COMMUNITY): Payer: Self-pay | Admitting: Internal Medicine

## 2020-06-29 ENCOUNTER — Ambulatory Visit (HOSPITAL_COMMUNITY)
Admission: RE | Admit: 2020-06-29 | Discharge: 2020-06-29 | Disposition: A | Discharge status: Home / Self Care | Payer: Medicare HMO | Source: Ambulatory Visit | Attending: Internal Medicine | Admitting: Internal Medicine

## 2020-06-29 DIAGNOSIS — M79662 Pain in left lower leg: Secondary | ICD-10-CM

## 2020-06-29 DIAGNOSIS — R2242 Localized swelling, mass and lump, left lower limb: Secondary | ICD-10-CM | POA: Diagnosis not present

## 2020-07-02 ENCOUNTER — Encounter (HOSPITAL_COMMUNITY): Payer: Self-pay | Admitting: Emergency Medicine

## 2020-07-02 ENCOUNTER — Other Ambulatory Visit: Payer: Self-pay

## 2020-07-02 ENCOUNTER — Emergency Department (HOSPITAL_COMMUNITY)
Admission: EM | Admit: 2020-07-02 | Discharge: 2020-07-02 | Disposition: A | Discharge status: Home / Self Care | Payer: Medicare HMO | Attending: Emergency Medicine | Admitting: Emergency Medicine

## 2020-07-02 ENCOUNTER — Emergency Department (HOSPITAL_COMMUNITY): Payer: Medicare HMO

## 2020-07-02 DIAGNOSIS — W230XXA Caught, crushed, jammed, or pinched between moving objects, initial encounter: Secondary | ICD-10-CM | POA: Diagnosis not present

## 2020-07-02 DIAGNOSIS — I10 Essential (primary) hypertension: Secondary | ICD-10-CM | POA: Insufficient documentation

## 2020-07-02 DIAGNOSIS — Z96641 Presence of right artificial hip joint: Secondary | ICD-10-CM | POA: Diagnosis not present

## 2020-07-02 DIAGNOSIS — Z79899 Other long term (current) drug therapy: Secondary | ICD-10-CM | POA: Insufficient documentation

## 2020-07-02 DIAGNOSIS — S61211A Laceration without foreign body of left index finger without damage to nail, initial encounter: Secondary | ICD-10-CM | POA: Insufficient documentation

## 2020-07-02 DIAGNOSIS — S6992XA Unspecified injury of left wrist, hand and finger(s), initial encounter: Secondary | ICD-10-CM | POA: Diagnosis not present

## 2020-07-02 NOTE — Discharge Instructions (Addendum)
Your laceration has been repaired with tissue adhesive.  Please read the attachment.  Do not put on any vaseline, topical antibiotic creams, or other ointments as it could prematurely dissolve the skin glue.  Try not to flex your affected finger as that could disturb the wound healing process.    You will need to follow up with your primary care provider in 3-4 days for wound check.    Monitor for evidence of infection.  Call orthopedics as needed.  Return to the ED or seek immediate medical attention should you experience any new or worsening symptoms.

## 2020-07-02 NOTE — ED Triage Notes (Signed)
Pt slammed left pointer finger in door. Laceration noted to finger. Bleeding controlled at this time.

## 2020-07-02 NOTE — ED Provider Notes (Addendum)
[facility] Provider Note   CSN: [id number] Arrival date & time: 07/02/20  1252     History No chief complaint on file.   Daniel Mathis is a 87 y.o. male with PMH of HTN who presents the ED after injury to finger.  On my examination, patient reports that he accidentally closed his left index finger into a car door, causing mildly bleeding laceration.  He is not on any anticoagulation.  His only past medical history is high blood pressure.  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  Patient reports that he is up-to-date on his tetanus immunization.  He denies any significant pain symptoms.  He is right-hand dominant.  Denies any other injuries.  Of note, he states that for the past couple of months, he has had diminished ability to flex his affected index finger at the PIP joint.  He states that he had heard a "pop" and ever since has had diminished range of motion.  He did not seek management because it did not bother him much.  HPI     Past Medical History:  Diagnosis Date   Hypertension     There are no problems to display for this patient.   Past Surgical History:  Procedure Laterality Date   TOTAL HIP ARTHROPLASTY Right        Family History  Problem Relation Age of Onset   Heart attack Father     Social History   Tobacco Use   Smoking status: Never   Smokeless tobacco: Never  Vaping Use   Vaping Use: Never used  Substance Use Topics   Alcohol use: Never   Drug use: Never    Home Medications Prior to Admission medications   Medication Sig Start Date End Date Taking? Authorizing Provider  amLODipine (NORVASC) 2.5 MG tablet Take 1 tablet by mouth daily. 07/17/17   [provider]  amoxicillin (AMOXIL) 500 MG capsule Take 1 capsule by mouth 3 (three) times daily. 07/21/17   [provider]  methocarbamol (ROBAXIN) 500 MG tablet Take 1 tablet (500 mg total) by mouth 2  (two) times daily as needed for muscle spasms. 01/03/18   Francine Graven, DO    Allergies    Patient has no known allergies.  Review of Systems   Review of Systems  All other systems reviewed and are negative.  Physical Exam Updated Vital Signs BP (!) 146/66 (BP Location: Right Arm)   Pulse 72   Temp 98.1 F (36.7 C) (Oral)   Resp 20   Ht 6' (1.829 m)   Wt 90.7 kg   SpO2 94%   BMI 27.12 kg/m   Physical Exam Vitals and nursing note reviewed. Exam conducted with a chaperone present.  Constitutional:      General: He is not in acute distress.    Appearance: Normal appearance. He is not ill-appearing.  HENT:     Head: Normocephalic and atraumatic.  Eyes:     General: No scleral icterus.    Conjunctiva/sclera: Conjunctivae normal.  Cardiovascular:     Rate and Rhythm: Normal rate.  Pulmonary:     Effort: Pulmonary effort is normal. No respiratory distress.  Musculoskeletal:        General: Signs of injury present.     Cervical back: Normal range of motion.     Comments: Left hand, index finger: 2 cm relatively well approximated mildly bleeding laceration over volar aspect of distal  phalanx.  No obvious bony involvement.  Does not appear to be contaminated.  Explored full depth of wound.  No nail plate or nailbed involvement.  There is very mild ecchymoses under nail plate, but no subungual hematoma.  Nothing requiring trephination at this time.  Radial pulse intact and symmetric contralateral hand.  Brisk capillary refill.  Sensation intact throughout.  Mildly diminished range of motion at PIP joint (subacute/chronic).  Otherwise functionally intact.  Good strength with flexion and extension against resistance.  Skin:    General: Skin is dry.  Neurological:     Mental Status: He is alert.     GCS: GCS eye subscore is 4. GCS verbal subscore is 5. GCS motor subscore is 6.  Psychiatric:        Mood and Affect: Mood normal.        Behavior: Behavior normal.        Thought  Content: Thought content normal.      ED Results / Procedures / Treatments   Labs (all labs ordered are listed, but only abnormal results are displayed) Labs Reviewed - No data to display  EKG None  Radiology DG Hand Complete Left  Result Date: 07/02/2020 CLINICAL DATA:  Closed index finger in car door, pain EXAM: LEFT HAND - COMPLETE 3+ VIEW COMPARISON:  None FINDINGS: Osseous demineralization. Joint spaces preserved. No acute fracture, dislocation, or bone destruction. Tiny radiopaque foreign body projects over the dorsum of the carpus on the lateral view. IMPRESSION: No acute osseous abnormalities. Electronically Signed   By: Lavonia Dana M.D.   On: 07/02/2020 13:22    Procedures .Marland KitchenLaceration Repair  Date/Time: 07/02/2020 6:13 PM Performed by: Corena Herter, PA-C Authorized by: Corena Herter, PA-C   Consent:    Consent obtained:  Verbal   Consent given by:  Patient   Risks discussed:  Infection, need for additional repair, pain, poor cosmetic result, poor wound healing, nerve damage, retained foreign body, tendon damage and vascular damage   Alternatives discussed:  No treatment and delayed treatment Universal protocol:    Procedure explained and questions answered to patient or proxy's satisfaction: yes     Relevant documents present and verified: yes     Test results available: yes     Imaging studies available: yes     Required blood products, implants, devices, and special equipment available: yes     Site/side marked: yes     Immediately prior to procedure, a time out was called: yes     Patient identity confirmed:  Verbally with patient Laceration details:    Location:  Finger   Finger location:  L index finger   Length (cm):  2 Skin repair:    Repair method:  Tissue adhesive Repair type:    Repair type:  Simple Post-procedure details:    Dressing:  Non-adherent dressing   Procedure completion:  Tolerated well, no immediate complications   Medications  Ordered in ED Medications - No data to display  ED Course  I have reviewed the triage vital signs and the nursing notes.  Pertinent labs & imaging results that were available during my care of the patient were reviewed by me and considered in my medical decision making (see chart for details).    MDM Rules/Calculators/A&P                          Daniel Mathis was evaluated in Emergency Department on 07/02/2020 for the symptoms  described in the history of present illness. He was evaluated in the context of the global COVID-19 pandemic, which necessitated consideration that the patient might be at risk for infection with the SARS-CoV-2 virus that causes COVID-19. Institutional protocols and algorithms that pertain to the evaluation of patients at risk for COVID-19 are in a state of rapid change based on information released by regulatory bodies including the CDC and federal and state organizations. These policies and algorithms were followed during the patient's care in the ED.  I personally reviewed patient's medical chart and all notes from triage and staff during today's encounter. I have also ordered and reviewed all labs and imaging that I felt to be medically necessary in the evaluation of this patient's complaints and with consideration of their physical exam. If needed, translation services were available and utilized.   Plain films are personally reviewed and demonstrate no acute osseous abnormalities.  There is no foreign body on my examination or obvious foreign body on imaging.  Does not appear to be contaminated.  I irrigated the wound copiously at the sink.  He is up-to-date on his tetanus immunization.  He denies any new functional constraints or limitations.  He denies any significant pain symptoms and is not requesting medications.  He is neurovascular intact.  Discussed suture versus tissue adhesive repair.  We also discussed bandage and close observation.  We will proceed with  tissue adhesive repair at this time.  Encouraging him to keep his wound clean and covered.  We will provide referral to local orthopedist, Dr. Aline Brochure, as needed given functional limitation at PIP joint.  However, patient does not seem particularly concerned.  He can likely follow-up with his primary care provider for wound check.  Low suspicion that he will get infected which is why I felt comfortable with the [facility].  ER return precautions discussed.  Patient voices understanding is agreeable to the plan.  Final Clinical Impression(s) / ED Diagnoses Final diagnoses:  Laceration of left index finger without foreign body without damage to nail, initial encounter    Rx / DC Orders ED Discharge Orders     None        Corena Herter, PA-C 07/02/20 1403    Corena Herter, PA-C 07/02/20 1814    Sherwood Gambler, MD 07/03/20 [id number]

## 2020-08-22 DIAGNOSIS — N529 Male erectile dysfunction, unspecified: Secondary | ICD-10-CM | POA: Diagnosis not present

## 2020-08-22 DIAGNOSIS — I10 Essential (primary) hypertension: Secondary | ICD-10-CM | POA: Diagnosis not present

## 2020-08-22 DIAGNOSIS — Z79899 Other long term (current) drug therapy: Secondary | ICD-10-CM | POA: Diagnosis not present

## 2020-08-22 DIAGNOSIS — E1159 Type 2 diabetes mellitus with other circulatory complications: Secondary | ICD-10-CM | POA: Diagnosis not present

## 2020-08-29 DIAGNOSIS — R7309 Other abnormal glucose: Secondary | ICD-10-CM | POA: Diagnosis not present

## 2020-08-29 DIAGNOSIS — E1100 Type 2 diabetes mellitus with hyperosmolarity without nonketotic hyperglycemic-hyperosmolar coma (NKHHC): Secondary | ICD-10-CM | POA: Diagnosis not present

## 2020-08-29 DIAGNOSIS — I10 Essential (primary) hypertension: Secondary | ICD-10-CM | POA: Diagnosis not present

## 2020-10-31 DIAGNOSIS — Z08 Encounter for follow-up examination after completed treatment for malignant neoplasm: Secondary | ICD-10-CM | POA: Diagnosis not present

## 2020-10-31 DIAGNOSIS — L570 Actinic keratosis: Secondary | ICD-10-CM | POA: Diagnosis not present

## 2020-10-31 DIAGNOSIS — X32XXXD Exposure to sunlight, subsequent encounter: Secondary | ICD-10-CM | POA: Diagnosis not present

## 2020-10-31 DIAGNOSIS — D225 Melanocytic nevi of trunk: Secondary | ICD-10-CM | POA: Diagnosis not present

## 2020-10-31 DIAGNOSIS — Z85828 Personal history of other malignant neoplasm of skin: Secondary | ICD-10-CM | POA: Diagnosis not present

## 2020-11-26 DIAGNOSIS — M9903 Segmental and somatic dysfunction of lumbar region: Secondary | ICD-10-CM | POA: Diagnosis not present

## 2020-11-26 DIAGNOSIS — M9902 Segmental and somatic dysfunction of thoracic region: Secondary | ICD-10-CM | POA: Diagnosis not present

## 2020-11-26 DIAGNOSIS — M5442 Lumbago with sciatica, left side: Secondary | ICD-10-CM | POA: Diagnosis not present

## 2020-11-26 DIAGNOSIS — M9905 Segmental and somatic dysfunction of pelvic region: Secondary | ICD-10-CM | POA: Diagnosis not present

## 2020-11-26 DIAGNOSIS — Z23 Encounter for immunization: Secondary | ICD-10-CM | POA: Diagnosis not present

## 2020-11-30 DIAGNOSIS — M5442 Lumbago with sciatica, left side: Secondary | ICD-10-CM | POA: Diagnosis not present

## 2020-11-30 DIAGNOSIS — M9905 Segmental and somatic dysfunction of pelvic region: Secondary | ICD-10-CM | POA: Diagnosis not present

## 2020-11-30 DIAGNOSIS — M9903 Segmental and somatic dysfunction of lumbar region: Secondary | ICD-10-CM | POA: Diagnosis not present

## 2020-11-30 DIAGNOSIS — M9902 Segmental and somatic dysfunction of thoracic region: Secondary | ICD-10-CM | POA: Diagnosis not present

## 2020-12-03 DIAGNOSIS — M9903 Segmental and somatic dysfunction of lumbar region: Secondary | ICD-10-CM | POA: Diagnosis not present

## 2020-12-03 DIAGNOSIS — M9902 Segmental and somatic dysfunction of thoracic region: Secondary | ICD-10-CM | POA: Diagnosis not present

## 2020-12-03 DIAGNOSIS — M9905 Segmental and somatic dysfunction of pelvic region: Secondary | ICD-10-CM | POA: Diagnosis not present

## 2020-12-03 DIAGNOSIS — M5442 Lumbago with sciatica, left side: Secondary | ICD-10-CM | POA: Diagnosis not present

## 2020-12-05 DIAGNOSIS — M5442 Lumbago with sciatica, left side: Secondary | ICD-10-CM | POA: Diagnosis not present

## 2020-12-05 DIAGNOSIS — M9903 Segmental and somatic dysfunction of lumbar region: Secondary | ICD-10-CM | POA: Diagnosis not present

## 2020-12-05 DIAGNOSIS — M9905 Segmental and somatic dysfunction of pelvic region: Secondary | ICD-10-CM | POA: Diagnosis not present

## 2020-12-05 DIAGNOSIS — M9902 Segmental and somatic dysfunction of thoracic region: Secondary | ICD-10-CM | POA: Diagnosis not present

## 2020-12-07 DIAGNOSIS — M9902 Segmental and somatic dysfunction of thoracic region: Secondary | ICD-10-CM | POA: Diagnosis not present

## 2020-12-07 DIAGNOSIS — M9903 Segmental and somatic dysfunction of lumbar region: Secondary | ICD-10-CM | POA: Diagnosis not present

## 2020-12-07 DIAGNOSIS — M9905 Segmental and somatic dysfunction of pelvic region: Secondary | ICD-10-CM | POA: Diagnosis not present

## 2020-12-07 DIAGNOSIS — M5442 Lumbago with sciatica, left side: Secondary | ICD-10-CM | POA: Diagnosis not present

## 2020-12-28 DIAGNOSIS — Z79899 Other long term (current) drug therapy: Secondary | ICD-10-CM | POA: Diagnosis not present

## 2020-12-28 DIAGNOSIS — E1159 Type 2 diabetes mellitus with other circulatory complications: Secondary | ICD-10-CM | POA: Diagnosis not present

## 2020-12-28 DIAGNOSIS — I10 Essential (primary) hypertension: Secondary | ICD-10-CM | POA: Diagnosis not present

## 2021-01-02 DIAGNOSIS — N481 Balanitis: Secondary | ICD-10-CM | POA: Diagnosis not present

## 2021-01-02 DIAGNOSIS — E1100 Type 2 diabetes mellitus with hyperosmolarity without nonketotic hyperglycemic-hyperosmolar coma (NKHHC): Secondary | ICD-10-CM | POA: Diagnosis not present

## 2021-01-02 DIAGNOSIS — I10 Essential (primary) hypertension: Secondary | ICD-10-CM | POA: Diagnosis not present

## 2021-01-02 DIAGNOSIS — R7309 Other abnormal glucose: Secondary | ICD-10-CM | POA: Diagnosis not present

## 2021-03-18 DIAGNOSIS — G309 Alzheimer's disease, unspecified: Secondary | ICD-10-CM | POA: Diagnosis not present

## 2021-03-18 DIAGNOSIS — I10 Essential (primary) hypertension: Secondary | ICD-10-CM | POA: Diagnosis not present

## 2021-03-18 DIAGNOSIS — R7309 Other abnormal glucose: Secondary | ICD-10-CM | POA: Diagnosis not present

## 2021-03-18 DIAGNOSIS — E1122 Type 2 diabetes mellitus with diabetic chronic kidney disease: Secondary | ICD-10-CM | POA: Diagnosis not present

## 2021-04-18 ENCOUNTER — Other Ambulatory Visit: Payer: Self-pay | Admitting: Podiatry

## 2021-04-18 ENCOUNTER — Other Ambulatory Visit (HOSPITAL_COMMUNITY): Payer: Self-pay | Admitting: Podiatry

## 2021-04-18 DIAGNOSIS — M79672 Pain in left foot: Secondary | ICD-10-CM | POA: Diagnosis not present

## 2021-04-19 ENCOUNTER — Other Ambulatory Visit (HOSPITAL_COMMUNITY): Payer: Self-pay

## 2021-04-26 ENCOUNTER — Encounter (HOSPITAL_COMMUNITY): Payer: Self-pay

## 2021-04-26 ENCOUNTER — Emergency Department (HOSPITAL_COMMUNITY)
Admission: EM | Admit: 2021-04-26 | Discharge: 2021-04-26 | Discharge status: Absent without leave | Payer: Medicare HMO | Source: Home / Self Care

## 2021-04-26 ENCOUNTER — Other Ambulatory Visit: Payer: Self-pay

## 2021-04-26 ENCOUNTER — Ambulatory Visit (HOSPITAL_COMMUNITY)
Admit: 2021-04-26 | Discharge: 2021-04-26 | Disposition: A | Discharge status: Home / Self Care | Payer: Medicare HMO | Source: Ambulatory Visit | Attending: Podiatry | Admitting: Podiatry

## 2021-04-26 DIAGNOSIS — I739 Peripheral vascular disease, unspecified: Secondary | ICD-10-CM | POA: Insufficient documentation

## 2021-04-26 DIAGNOSIS — M79672 Pain in left foot: Secondary | ICD-10-CM | POA: Diagnosis not present

## 2021-04-26 NOTE — ED Provider Notes (Signed)
Patient arrived to the ED in error.  He has been having issues with foot pain for several months.  He saw his podiatrist a couple of weeks ago and states he was told to come to the hospital today.  Patient denies any acute changes.  Was able to review outpatient imaging tests and the patient is actually scheduled for an arterial ultrasound today.  He was not supposed to be coming to the emergency room.  Patient was escorted over to the radiology suite to have his outpatient study. ? ?  ?Dorie Rank, MD ?04/26/21 [phone] ? ?

## 2021-04-26 NOTE — ED Triage Notes (Signed)
Patient arrived with complaints of left foot pain x3 months. Patient states podiatrist sent him to ED for evaluation. Patient states pain is intermittent and gets worse when laying in bed at night.  ?

## 2021-04-26 NOTE — ED Notes (Signed)
Patient accidentally came through ED entrance for vascular appointment. Patient redirected to radiology. ?

## 2021-05-02 DIAGNOSIS — E1159 Type 2 diabetes mellitus with other circulatory complications: Secondary | ICD-10-CM | POA: Diagnosis not present

## 2021-05-02 DIAGNOSIS — M79672 Pain in left foot: Secondary | ICD-10-CM | POA: Diagnosis not present

## 2021-05-07 DIAGNOSIS — L570 Actinic keratosis: Secondary | ICD-10-CM | POA: Diagnosis not present

## 2021-05-07 DIAGNOSIS — Z85828 Personal history of other malignant neoplasm of skin: Secondary | ICD-10-CM | POA: Diagnosis not present

## 2021-05-07 DIAGNOSIS — X32XXXD Exposure to sunlight, subsequent encounter: Secondary | ICD-10-CM | POA: Diagnosis not present

## 2021-05-07 DIAGNOSIS — Z08 Encounter for follow-up examination after completed treatment for malignant neoplasm: Secondary | ICD-10-CM | POA: Diagnosis not present

## 2021-05-09 DIAGNOSIS — R7309 Other abnormal glucose: Secondary | ICD-10-CM | POA: Diagnosis not present

## 2021-05-09 DIAGNOSIS — I10 Essential (primary) hypertension: Secondary | ICD-10-CM | POA: Diagnosis not present

## 2021-05-09 DIAGNOSIS — I152 Hypertension secondary to endocrine disorders: Secondary | ICD-10-CM | POA: Diagnosis not present

## 2021-05-29 DIAGNOSIS — G629 Polyneuropathy, unspecified: Secondary | ICD-10-CM | POA: Diagnosis not present

## 2021-07-11 DIAGNOSIS — M79672 Pain in left foot: Secondary | ICD-10-CM | POA: Diagnosis not present

## 2021-07-11 DIAGNOSIS — G629 Polyneuropathy, unspecified: Secondary | ICD-10-CM | POA: Diagnosis not present

## 2021-08-19 DIAGNOSIS — L720 Epidermal cyst: Secondary | ICD-10-CM | POA: Diagnosis not present

## 2021-09-04 DIAGNOSIS — E1159 Type 2 diabetes mellitus with other circulatory complications: Secondary | ICD-10-CM | POA: Diagnosis not present

## 2021-09-04 DIAGNOSIS — Z79899 Other long term (current) drug therapy: Secondary | ICD-10-CM | POA: Diagnosis not present

## 2021-09-04 DIAGNOSIS — I10 Essential (primary) hypertension: Secondary | ICD-10-CM | POA: Diagnosis not present

## 2021-09-18 DIAGNOSIS — R7309 Other abnormal glucose: Secondary | ICD-10-CM | POA: Diagnosis not present

## 2021-09-18 DIAGNOSIS — I10 Essential (primary) hypertension: Secondary | ICD-10-CM | POA: Diagnosis not present

## 2021-09-18 DIAGNOSIS — G629 Polyneuropathy, unspecified: Secondary | ICD-10-CM | POA: Diagnosis not present

## 2021-09-18 DIAGNOSIS — E1169 Type 2 diabetes mellitus with other specified complication: Secondary | ICD-10-CM | POA: Diagnosis not present

## 2021-09-25 DIAGNOSIS — X32XXXD Exposure to sunlight, subsequent encounter: Secondary | ICD-10-CM | POA: Diagnosis not present

## 2021-09-25 DIAGNOSIS — L570 Actinic keratosis: Secondary | ICD-10-CM | POA: Diagnosis not present

## 2021-09-25 DIAGNOSIS — D045 Carcinoma in situ of skin of trunk: Secondary | ICD-10-CM | POA: Diagnosis not present

## 2021-10-10 ENCOUNTER — Ambulatory Visit (HOSPITAL_COMMUNITY)
Admission: RE | Admit: 2021-10-10 | Discharge: 2021-10-10 | Disposition: A | Discharge status: Home / Self Care | Payer: Medicare HMO | Source: Ambulatory Visit | Attending: Podiatry | Admitting: Podiatry

## 2021-10-10 ENCOUNTER — Other Ambulatory Visit (HOSPITAL_COMMUNITY): Payer: Self-pay | Admitting: Podiatry

## 2021-10-10 ENCOUNTER — Other Ambulatory Visit: Payer: Self-pay | Admitting: Podiatry

## 2021-10-10 DIAGNOSIS — G629 Polyneuropathy, unspecified: Secondary | ICD-10-CM | POA: Diagnosis not present

## 2021-10-10 DIAGNOSIS — I824Y2 Acute embolism and thrombosis of unspecified deep veins of left proximal lower extremity: Secondary | ICD-10-CM | POA: Diagnosis not present

## 2021-10-10 DIAGNOSIS — R600 Localized edema: Secondary | ICD-10-CM | POA: Insufficient documentation

## 2021-10-10 DIAGNOSIS — M79672 Pain in left foot: Secondary | ICD-10-CM | POA: Diagnosis not present

## 2021-10-10 DIAGNOSIS — I82402 Acute embolism and thrombosis of unspecified deep veins of left lower extremity: Secondary | ICD-10-CM | POA: Diagnosis not present

## 2021-11-29 ENCOUNTER — Emergency Department (HOSPITAL_COMMUNITY): Payer: Medicare HMO

## 2021-11-29 ENCOUNTER — Encounter (HOSPITAL_COMMUNITY): Payer: Self-pay

## 2021-11-29 ENCOUNTER — Observation Stay (HOSPITAL_COMMUNITY)
Admission: EM | Admit: 2021-11-29 | Discharge: 2021-12-01 | Disposition: A | Discharge status: Home / Self Care | Payer: Medicare HMO | Attending: Family Medicine | Admitting: Family Medicine

## 2021-11-29 DIAGNOSIS — I10 Essential (primary) hypertension: Secondary | ICD-10-CM

## 2021-11-29 DIAGNOSIS — R260 Ataxic gait: Secondary | ICD-10-CM | POA: Diagnosis not present

## 2021-11-29 DIAGNOSIS — Z96641 Presence of right artificial hip joint: Secondary | ICD-10-CM | POA: Insufficient documentation

## 2021-11-29 DIAGNOSIS — G629 Polyneuropathy, unspecified: Secondary | ICD-10-CM

## 2021-11-29 DIAGNOSIS — R270 Ataxia, unspecified: Secondary | ICD-10-CM | POA: Diagnosis not present

## 2021-11-29 DIAGNOSIS — E876 Hypokalemia: Secondary | ICD-10-CM | POA: Diagnosis not present

## 2021-11-29 DIAGNOSIS — U071 COVID-19: Secondary | ICD-10-CM | POA: Insufficient documentation

## 2021-11-29 DIAGNOSIS — R531 Weakness: Secondary | ICD-10-CM

## 2021-11-29 DIAGNOSIS — Z79899 Other long term (current) drug therapy: Secondary | ICD-10-CM | POA: Insufficient documentation

## 2021-11-29 DIAGNOSIS — R4781 Slurred speech: Secondary | ICD-10-CM | POA: Insufficient documentation

## 2021-11-29 DIAGNOSIS — R400 Somnolence: Secondary | ICD-10-CM | POA: Diagnosis not present

## 2021-11-29 HISTORY — DX: Essential (primary) hypertension: I10

## 2021-11-29 HISTORY — DX: Ataxia, unspecified: R27.0

## 2021-11-29 HISTORY — DX: Weakness: R53.1

## 2021-11-29 HISTORY — DX: Polyneuropathy, unspecified: G62.9

## 2021-11-29 LAB — URINALYSIS, ROUTINE W REFLEX MICROSCOPIC
Bacteria, UA: NONE SEEN
Bilirubin Urine: NEGATIVE
Glucose, UA: 50 mg/dL — AB
Ketones, ur: 5 mg/dL — AB
Leukocytes,Ua: NEGATIVE
Nitrite: NEGATIVE
Protein, ur: NEGATIVE mg/dL
Specific Gravity, Urine: 1.02 (ref 1.005–1.030)
pH: 5 (ref 5.0–8.0)

## 2021-11-29 LAB — CBC
HCT: 42.2 % (ref 39.0–52.0)
Hemoglobin: 14.3 g/dL (ref 13.0–17.0)
MCH: 30.1 pg (ref 26.0–34.0)
MCHC: 33.9 g/dL (ref 30.0–36.0)
MCV: 88.8 fL (ref 80.0–100.0)
Platelets: 140 10*3/uL — ABNORMAL LOW (ref 150–400)
RBC: 4.75 MIL/uL (ref 4.22–5.81)
RDW: 13 % (ref 11.5–15.5)
WBC: 9.3 10*3/uL (ref 4.0–10.5)
nRBC: 0 % (ref 0.0–0.2)

## 2021-11-29 LAB — BASIC METABOLIC PANEL
Anion gap: 8 (ref 5–15)
BUN: 19 mg/dL (ref 8–23)
CO2: 29 mmol/L (ref 22–32)
Calcium: 8.8 mg/dL — ABNORMAL LOW (ref 8.9–10.3)
Chloride: 100 mmol/L (ref 98–111)
Creatinine, Ser: 1.24 mg/dL (ref 0.61–1.24)
GFR, Estimated: 56 mL/min — ABNORMAL LOW (ref >60)
Glucose, Bld: 148 mg/dL — ABNORMAL HIGH (ref 70–99)
Potassium: 3 mmol/L — ABNORMAL LOW (ref 3.5–5.1)
Sodium: 137 mmol/L (ref 135–145)

## 2021-11-29 LAB — RESP PANEL BY RT-PCR (FLU A&B, COVID) ARPGX2
Influenza A by PCR: NEGATIVE
Influenza B by PCR: NEGATIVE
SARS Coronavirus 2 by RT PCR: POSITIVE — AB

## 2021-11-29 MED ORDER — ASPIRIN 81 MG PO TBEC
81.0000 mg | DELAYED_RELEASE_TABLET | Freq: Every day | ORAL | Status: DC
  Administered 2021-11-30 – 2021-12-01 (×2): 81 mg via ORAL
  Filled 2021-11-29 (×2): qty 1

## 2021-11-29 MED ORDER — POTASSIUM CHLORIDE CRYS ER 20 MEQ PO TBCR
40.0000 meq | EXTENDED_RELEASE_TABLET | Freq: Four times a day (QID) | ORAL | Status: AC
  Administered 2021-11-29 – 2021-11-30 (×2): 40 meq via ORAL
  Filled 2021-11-29 (×2): qty 2

## 2021-11-29 MED ORDER — ASPIRIN 81 MG PO TBEC
81.0000 mg | DELAYED_RELEASE_TABLET | Freq: Once | ORAL | Status: AC
  Administered 2021-11-29: 81 mg via ORAL
  Filled 2021-11-29: qty 1

## 2021-11-29 NOTE — H&P (Signed)
TRH H&P   Patient Demographics:    Daniel Mathis, is a 88 y.o. male  MRN: [id number]   DOB - 07/21/1931  Admit Date - 11/29/2021  Outpatient Primary MD for the patient is Asencion Noble, MD  Referring MD/NP/PA: Dr Roderic Palau  Patient coming from: Home  Chief Complaint  Patient presents with   Fall      HPI:    Daniel Mathis  is a 88 y.o. male, past medical of hypertension,neuropathy, home by himself, independent, patient was at recent fishing trip went to Wednesday, when he came back on Thursday he was noted by a family friend with multiple surgical deficiencies, he had slurred speech, very unsteady gait, as well patient report he noted these findings, he denies any altered mental status, syncope, any other focal deficits, reports he feels his gait unsteady which is new to him, these findings been at least for last 2 days, he had a fall where he fell and hit the back of his head, denies any loss of consciousness, he denies dysuria, polyuria, but he does report flu symptoms, where he has runny nose, feeling some chills. -In ED CT head with no acute finding, UA was negative, EKG and telemetry showing normal sinus rhythm, no significant labs abnormalities, but he had unsteady gait for which [facility] hospitalist consulted to admit.    Review of systems:     A full 10 point Review of Systems was done, except as stated above, all other Review of Systems were negative.   With Past History of the following :    Past Medical History:  Diagnosis Date   Hypertension       Past Surgical History:  Procedure Laterality Date   TOTAL HIP ARTHROPLASTY Right       Social History:     Social History   Tobacco Use   Smoking status: Never   Smokeless tobacco: Never  Substance Use Topics   Alcohol use: Never     Family History :     Family History  Problem Relation Age of Onset    Heart attack Father       Home Medications:   Prior to Admission medications   Medication Sig Start Date End Date Taking? Authorizing Provider  hydrochlorothiazide (HYDRODIURIL) 25 MG tablet Take 25 mg by mouth daily. 11/25/21  Yes [provider]  losartan (COZAAR) 100 MG tablet Take 100 mg by mouth daily. 09/30/21  Yes [provider]  pregabalin (LYRICA) 100 MG capsule Take 100 mg by mouth 3 (three) times daily. 11/12/21  Yes [provider]  amLODipine (NORVASC) 2.5 MG tablet Take 1 tablet by mouth daily. 07/17/17   [provider]  amoxicillin (AMOXIL) 500 MG capsule Take 1 capsule by mouth 3 (three) times daily. 07/21/17   [provider]  methocarbamol (ROBAXIN) 500 MG tablet Take 1 tablet (500 mg total) by mouth 2 (  two) times daily as needed for muscle spasms. 01/03/18   Francine Graven, DO     Allergies:    No Known Allergies   Physical Exam:   Vitals  Blood pressure (!) 150/58, pulse 70, temperature 98.2 F (36.8 C), temperature source Oral, resp. rate 16, height 6' (1.829 m), weight 90.7 kg, SpO2 96 %.   1. General developed male lying in bed in NAD  2. Normal affect and insight, Not Suicidal or Homicidal, Awake Alert, Oriented X 3.  3.  Appears to be with significant tremors, strength 5 out of 5 in all extremities, no cranial deficits noted, but slow eye tracking, but no nystagmus noted , he has positive Romberg's sign, significant ataxia upon ambulation .  4. Ears appear Normal, Conjunctivae clear, PERRLA. Moist Oral Mucosa.  5. Supple Neck, No JVD, No cervical lymphadenopathy appriciated, No Carotid Bruits.  6. Symmetrical Chest wall movement, Good air movement bilaterally, CTAB.  7. RRR, No Gallops, Rubs or Murmurs, No Parasternal Heave.  8. Positive Bowel Sounds, Abdomen Soft, No tenderness, No organomegaly appriciated,No rebound -guarding or rigidity.  9.  No Cyanosis, Normal Skin Turgor, No Skin Rash or  Bruise.  10. Good muscle tone,  joints appear normal , no effusions, Normal ROM.    Data Review:    CBC Recent Labs  Lab 11/29/21 1908  WBC 9.3  HGB 14.3  HCT 42.2  PLT 140*  MCV 88.8  MCH 30.1  MCHC 33.9  RDW 13.0   ------------------------------------------------------------------------------------------------------------------  Chemistries  Recent Labs  Lab 11/29/21 1908  NA 137  K 3.0*  CL 100  CO2 29  GLUCOSE 148*  BUN 19  CREATININE 1.24  CALCIUM 8.8*   ------------------------------------------------------------------------------------------------------------------ estimated creatinine clearance is 45.2 mL/min (by C-G formula based on SCr of 1.24 mg/dL). ------------------------------------------------------------------------------------------------------------------ No results for input(s): "TSH", "T4TOTAL", "T3FREE", "THYROIDAB" in the last 72 hours.  Invalid input(s): "FREET3"  Coagulation profile No results for input(s): "INR", "PROTIME" in the last 168 hours. ------------------------------------------------------------------------------------------------------------------- No results for input(s): "DDIMER" in the last 72 hours. -------------------------------------------------------------------------------------------------------------------  Cardiac Enzymes No results for input(s): "CKMB", "TROPONINI", "MYOGLOBIN" in the last 168 hours.  Invalid input(s): "CK" ------------------------------------------------------------------------------------------------------------------ No results found for: "BNP"   ---------------------------------------------------------------------------------------------------------------  Urinalysis    Component Value Date/Time   COLORURINE YELLOW 11/29/2021 1912   APPEARANCEUR CLEAR 11/29/2021 1912   LABSPEC 1.020 11/29/2021 1912   PHURINE 5.0 11/29/2021 1912   GLUCOSEU 50 (A) 11/29/2021 1912   HGBUR SMALL (A)  11/29/2021 [place] NEGATIVE 11/29/2021 1912   KETONESUR 5 (A) 11/29/2021 1912   PROTEINUR NEGATIVE 11/29/2021 1912   NITRITE NEGATIVE 11/29/2021 1912   LEUKOCYTESUR NEGATIVE 11/29/2021 1912    ----------------------------------------------------------------------------------------------------------------   Imaging Results:    CT Head Wo Contrast  Result Date: 11/29/2021 CLINICAL DATA:  Altered level of consciousness, difficulty ambulating, slurred speech for 2 days EXAM: CT HEAD WITHOUT CONTRAST TECHNIQUE: Contiguous axial images were obtained from the base of the skull through the vertex without intravenous contrast. RADIATION DOSE REDUCTION: This exam was performed according to the departmental dose-optimization program which includes automated exposure control, adjustment of the mA and/or kV according to patient size and/or use of iterative reconstruction technique. COMPARISON:  03/19/2006 FINDINGS: Brain: No acute infarct or hemorrhage. Lateral ventricles and midline structures are unremarkable. No acute extra-axial fluid collections. No mass effect. Vascular: No hyperdense vessel or unexpected calcification. Skull: Normal. Negative for fracture or focal lesion. Sinuses/Orbits: No acute finding. Other: None. IMPRESSION: 1. No acute intracranial  process. Electronically Signed   By: Randa Ngo M.D.   On: 11/29/2021 20:12    My personal review of EKG: Rhythm NSR, Rate  67 /min, QTc 439   Assessment & Plan:    Principal Problem:   Weakness Active Problems:   Neuropathy   Essential hypertension   Ataxia    Focal neurological deficit including ataxia and slurred speech -Patient with unsteady gait, positive Romberg sign. -He will be admitted for CVA/TIA work-up -will be admitted to [facility] given no MRI capacity at [facility] over the weekend -will obtain 2D echo, continue on telemetry monitoring, CTA head and neck, MRI brain, lipid panel, A1c. -We will start on  aspirin 81 mg oral daily -Finding her less likely attributed to her medication Lyrica or Robaxin, has not recent change in the dosing, renal function is stable -PT/OT/SLP consult -Does report some flu symptoms as well, so we will check respiratory panel for flu and COVID-19  Hypokalemia -repleted  Hypertension -Sugars acceptable, hold home meds and allow for permissive hypertension MRI  Neuropathy -Hold Lyrica for now given new neurological findings   DVT Prophylaxis   Lovenox  AM Labs Ordered, also please review Full Orders  Family Communication: Admission, patients condition and plan of care including tests being ordered have been discussed with the patient and  family friend at bedside who indicate understanding and agree with the plan and Code Status.  Code Status Full  Likely DC to  pending PT  Condition GUARDED    Consults called: none    Admission status: observation    Time spent in minutes : 65 minutes   Phillips Climes M.D on 11/29/2021 at 9:35 PM   [facility] - Office  [phone]

## 2021-11-29 NOTE — ED Provider Notes (Signed)
[facility] EMERGENCY DEPARTMENT Provider Note   CSN: [id number] Arrival date & time: 11/29/21  1700     History {Add pertinent medical, surgical, social history, OB history to HPI:1} Chief Complaint  Patient presents with   Daniel Mathis is a 88 y.o. male.  On Wednesday the patient had a fall and has had slurred speech off-and-on since Wednesday weakness in his legs with additional falls.   Fall       Home Medications Prior to Admission medications   Medication Sig Start Date End Date Taking? Authorizing Provider  hydrochlorothiazide (HYDRODIURIL) 25 MG tablet Take 25 mg by mouth daily. 11/25/21  Yes [provider]  losartan (COZAAR) 100 MG tablet Take 100 mg by mouth daily. 09/30/21  Yes [provider]  pregabalin (LYRICA) 100 MG capsule Take 100 mg by mouth 3 (three) times daily. 11/12/21  Yes [provider]  amLODipine (NORVASC) 2.5 MG tablet Take 1 tablet by mouth daily. 07/17/17   [provider]  amoxicillin (AMOXIL) 500 MG capsule Take 1 capsule by mouth 3 (three) times daily. 07/21/17   [provider]  methocarbamol (ROBAXIN) 500 MG tablet Take 1 tablet (500 mg total) by mouth 2 (two) times daily as needed for muscle spasms. 01/03/18   Francine Graven, DO      Allergies    Patient has no known allergies.    Review of Systems   Review of Systems  Physical Exam Updated Vital Signs BP (!) 150/58 (BP Location: Right Arm)   Pulse 70   Temp 98.2 F (36.8 C) (Oral)   Resp 16   Ht 6' (1.829 m)   Wt 90.7 kg   SpO2 96%   BMI 27.12 kg/m  Physical Exam  ED Results / Procedures / Treatments   Labs (all labs ordered are listed, but only abnormal results are displayed) Labs Reviewed  BASIC METABOLIC PANEL - Abnormal; Notable for the following components:      Result Value   Potassium 3.0 (*)    Glucose, Bld 148 (*)    Calcium 8.8 (*)    GFR, Estimated 56 (*)    All other components within normal  limits  CBC - Abnormal; Notable for the following components:   Platelets 140 (*)    All other components within normal limits  URINALYSIS, ROUTINE W REFLEX MICROSCOPIC - Abnormal; Notable for the following components:   Glucose, UA 50 (*)    Hgb urine dipstick SMALL (*)    Ketones, ur 5 (*)    All other components within normal limits  CBG MONITORING, ED    EKG None  Radiology CT Head Wo Contrast  Result Date: 11/29/2021 CLINICAL DATA:  Altered level of consciousness, difficulty ambulating, slurred speech for 2 days EXAM: CT HEAD WITHOUT CONTRAST TECHNIQUE: Contiguous axial images were obtained from the base of the skull through the vertex without intravenous contrast. RADIATION DOSE REDUCTION: This exam was performed according to the departmental dose-optimization program which includes automated exposure control, adjustment of the mA and/or kV according to patient size and/or use of iterative reconstruction technique. COMPARISON:  03/19/2006 FINDINGS: Brain: No acute infarct or hemorrhage. Lateral ventricles and midline structures are unremarkable. No acute extra-axial fluid collections. No mass effect. Vascular: No hyperdense vessel or unexpected calcification. Skull: Normal. Negative for fracture or focal lesion. Sinuses/Orbits: No acute finding. Other: None. IMPRESSION: 1. No acute intracranial process. Electronically Signed   By: Randa Ngo M.D.   On: 11/29/2021  20:12    Procedures Procedures  {Document cardiac monitor, telemetry assessment procedure when appropriate:1}  Medications Ordered in ED Medications  aspirin EC tablet 81 mg (has no administration in time range)    ED Course/ Medical Decision Making/ A&P                           Medical Decision Making Amount and/or Complexity of Data Reviewed Labs: ordered. Radiology: ordered.  Risk Decision regarding hospitalization.   Patient with ataxia and episodes of slurred speech.  He will be admitted to  medicine  {Document critical care time when appropriate:1} {Document review of labs and clinical decision tools ie heart score, Chads2Vasc2 etc:1}  {Document your independent review of radiology images, and any outside records:1} {Document your discussion with family members, caretakers, and with consultants:1} {Document social determinants of health affecting pt's care:1} {Document your decision making why or why not admission, treatments were needed:1} Final Clinical Impression(s) / ED Diagnoses Final diagnoses:  Weakness    Rx / DC Orders ED Discharge Orders     None

## 2021-11-29 NOTE — ED Notes (Signed)
Patient noted to be incontinent of urine. Patient unaware of this and states this is not normal.

## 2021-11-29 NOTE — ED Triage Notes (Addendum)
Pt says went on a fishing trip and had been on a boat for several days and reports difficutly walking after being on the boat for a couple of days  Says symptoms started after fishing on Wednesday.    Says was having difficulty with his balance on Wednesday.  Friend with patient says some of his fishing buddies had mentioned his speech sounded different Wednesday.   Reports fell a couple of times while he was out of town but still having difficulty walking today.   Pt says feels off balance when he tries to walk.  Speech sounds slurred at times during triage but patient and his visitor says is his normal.   Denies weakness.  Pt was incontinent of urine and says that is not normal for him.

## 2021-11-30 ENCOUNTER — Encounter (HOSPITAL_COMMUNITY): Payer: Self-pay | Admitting: Family Medicine

## 2021-11-30 ENCOUNTER — Observation Stay (HOSPITAL_COMMUNITY)
Admit: 2021-11-30 | Discharge: 2021-11-30 | Disposition: A | Discharge status: Home / Self Care | Payer: Medicare HMO | Attending: Internal Medicine | Admitting: Internal Medicine

## 2021-11-30 ENCOUNTER — Observation Stay (HOSPITAL_BASED_OUTPATIENT_CLINIC_OR_DEPARTMENT_OTHER): Payer: Medicare HMO

## 2021-11-30 ENCOUNTER — Other Ambulatory Visit: Payer: Self-pay

## 2021-11-30 ENCOUNTER — Observation Stay (HOSPITAL_COMMUNITY): Payer: Medicare HMO

## 2021-11-30 DIAGNOSIS — R4781 Slurred speech: Secondary | ICD-10-CM | POA: Diagnosis not present

## 2021-11-30 DIAGNOSIS — G459 Transient cerebral ischemic attack, unspecified: Secondary | ICD-10-CM

## 2021-11-30 DIAGNOSIS — R270 Ataxia, unspecified: Secondary | ICD-10-CM | POA: Diagnosis not present

## 2021-11-30 DIAGNOSIS — U071 COVID-19: Secondary | ICD-10-CM | POA: Insufficient documentation

## 2021-11-30 DIAGNOSIS — I6622 Occlusion and stenosis of left posterior cerebral artery: Secondary | ICD-10-CM | POA: Diagnosis not present

## 2021-11-30 DIAGNOSIS — R531 Weakness: Secondary | ICD-10-CM

## 2021-11-30 DIAGNOSIS — I6522 Occlusion and stenosis of left carotid artery: Secondary | ICD-10-CM | POA: Diagnosis not present

## 2021-11-30 DIAGNOSIS — M4802 Spinal stenosis, cervical region: Secondary | ICD-10-CM | POA: Diagnosis not present

## 2021-11-30 DIAGNOSIS — R251 Tremor, unspecified: Secondary | ICD-10-CM | POA: Diagnosis not present

## 2021-11-30 HISTORY — DX: COVID-19: U07.1

## 2021-11-30 LAB — ECHOCARDIOGRAM COMPLETE
Area-P 1/2: 4.1 cm2
Height: 72 in
S' Lateral: 2.4 cm
Weight: 3200 oz

## 2021-11-30 LAB — LIPID PANEL
Cholesterol: 146 mg/dL (ref 0–200)
HDL: 30 mg/dL — ABNORMAL LOW (ref >40)
LDL Cholesterol: 97 mg/dL (ref 0–99)
Total CHOL/HDL Ratio: 4.9 RATIO
Triglycerides: 96 mg/dL (ref <150)
VLDL: 19 mg/dL (ref 0–40)

## 2021-11-30 LAB — HEMOGLOBIN A1C
Hgb A1c MFr Bld: 6.9 % — ABNORMAL HIGH (ref 4.8–5.6)
Mean Plasma Glucose: 151.33 mg/dL

## 2021-11-30 MED ORDER — ACETAMINOPHEN 325 MG PO TABS: 650.0000 mg | ORAL_TABLET | ORAL | Status: DC | PRN

## 2021-11-30 MED ORDER — FAMOTIDINE 20 MG PO TABS
20.0000 mg | ORAL_TABLET | Freq: Every day | ORAL | Status: DC
  Administered 2021-12-01: 20 mg via ORAL
  Filled 2021-11-30: qty 1

## 2021-11-30 MED ORDER — NIRMATRELVIR/RITONAVIR (PAXLOVID) TABLET (RENAL DOSING)
2.0000 | ORAL_TABLET | Freq: Two times a day (BID) | ORAL | Status: DC
  Administered 2021-11-30 – 2021-12-01 (×2): 2 via ORAL
  Filled 2021-11-30: qty 20

## 2021-11-30 MED ORDER — VITAMIN C 500 MG PO TABS
500.0000 mg | ORAL_TABLET | Freq: Every day | ORAL | Status: DC
  Administered 2021-11-30 – 2021-12-01 (×2): 500 mg via ORAL
  Filled 2021-11-30 (×2): qty 1

## 2021-11-30 MED ORDER — ZINC SULFATE 220 (50 ZN) MG PO CAPS
220.0000 mg | ORAL_CAPSULE | Freq: Every day | ORAL | Status: DC
  Administered 2021-11-30 – 2021-12-01 (×2): 220 mg via ORAL
  Filled 2021-11-30 (×2): qty 1

## 2021-11-30 MED ORDER — PREDNISONE 20 MG PO TABS
40.0000 mg | ORAL_TABLET | Freq: Every day | ORAL | Status: DC
  Administered 2021-11-30 – 2021-12-01 (×2): 40 mg via ORAL
  Filled 2021-11-30 (×2): qty 2

## 2021-11-30 MED ORDER — CLOPIDOGREL BISULFATE 75 MG PO TABS
75.0000 mg | ORAL_TABLET | Freq: Every day | ORAL | Status: DC
  Administered 2021-12-01: 75 mg via ORAL
  Filled 2021-11-30: qty 1

## 2021-11-30 MED ORDER — SODIUM CHLORIDE 0.9 % IV SOLN: INTRAVENOUS | Status: DC

## 2021-11-30 MED ORDER — STROKE: EARLY STAGES OF RECOVERY BOOK
Freq: Once | Status: AC
  Filled 2021-11-30: qty 1

## 2021-11-30 MED ORDER — ENOXAPARIN SODIUM 40 MG/0.4ML IJ SOSY
40.0000 mg | PREFILLED_SYRINGE | INTRAMUSCULAR | Status: DC
  Administered 2021-11-30 – 2021-12-01 (×2): 40 mg via SUBCUTANEOUS
  Filled 2021-11-30 (×2): qty 0.4

## 2021-11-30 MED ORDER — PREGABALIN 50 MG PO CAPS
100.0000 mg | ORAL_CAPSULE | Freq: Three times a day (TID) | ORAL | Status: DC
  Administered 2021-11-30 – 2021-12-01 (×3): 100 mg via ORAL
  Filled 2021-11-30 (×3): qty 2

## 2021-11-30 MED ORDER — ACETAMINOPHEN 650 MG RE SUPP: 650.0000 mg | RECTAL | Status: DC | PRN

## 2021-11-30 MED ORDER — ACETAMINOPHEN 160 MG/5ML PO SOLN: 650.0000 mg | ORAL | Status: DC | PRN

## 2021-11-30 MED ORDER — POTASSIUM CHLORIDE IN NACL 20-0.9 MEQ/L-% IV SOLN
INTRAVENOUS | Status: AC
  Filled 2021-11-30: qty 1000

## 2021-11-30 MED ORDER — IOHEXOL 350 MG/ML SOLN
100.0000 mL | Freq: Once | INTRAVENOUS | Status: AC | PRN
  Administered 2021-11-30: 100 mL via INTRAVENOUS

## 2021-11-30 NOTE — Assessment & Plan Note (Signed)
-   Currently stable, continue to hold Lyrica In the setting of bursitis neuro evaluation Ruling out acute CVA -We will resume in a.m.

## 2021-11-30 NOTE — Hospital Course (Signed)
Daniel Mathis  is a 88 y.o. male, past medical of hypertension,neuropathy, home by himself, independent, patient was at recent fishing trip went to Wednesday, when he came back on Thursday he was noted by a family friend with multiple surgical deficiencies, he had slurred speech, very unsteady gait, as well patient report he noted these findings, he denies any altered mental status, syncope, any other focal deficits, reports he feels his gait unsteady which is new to him, these findings been at least for last 2 days, he had a fall where he fell and hit the back of his head, denies any loss of consciousness, he denies dysuria, polyuria, but he does report flu symptoms, where he has runny nose, feeling some chills. -In ED CT head with no acute finding, UA was negative, EKG and telemetry showing normal sinus rhythm, no significant labs abnormalities, but he had unsteady gait for which [facility] hospitalist consulted to admit.

## 2021-11-30 NOTE — ED Notes (Signed)
Patient transported to CT 

## 2021-11-30 NOTE — Progress Notes (Addendum)
PROGRESS NOTE    Patient: Daniel Mathis                            PCP: Asencion Noble, MD                    DOB: Jun 15, 1931            DOA: 11/29/2021 ONG:[id number]             DOS: 11/30/2021, 11:19 AM   LOS: 0 days   Date of Service: The patient was seen and examined on 11/30/2021  Subjective:   The patient was seen and examined this morning. Hemodynamically stable Complain generalized pain  Mild shortness of breath with exertion, mild cough, otherwise on room air   Brief Narrative:    [name]  is a 88 y.o. male, past medical of hypertension,neuropathy, home by himself, independent, patient was at recent fishing trip went to Wednesday, when he came back on Thursday he was noted by a family friend with multiple surgical deficiencies, he had slurred speech, very unsteady gait, as well patient report he noted these findings, he denies any altered mental status, syncope, any other focal deficits, reports he feels his gait unsteady which is new to him, these findings been at least for last 2 days, he had a fall where he fell and hit the back of his head, denies any loss of consciousness, he denies dysuria, polyuria, but he does report flu symptoms, where he has runny nose, feeling some chills. -In ED CT head with no acute finding, UA was negative, EKG and telemetry showing normal sinus rhythm, no significant labs abnormalities, but he had unsteady gait for which [facility] hospitalist consulted to admit.    Assessment & Plan:   Principal Problem:   Weakness Active Problems:   SARS-CoV-2 positive   Neuropathy   Essential hypertension   Ataxia     Assessment and Plan: SARS-CoV-2 positive -Incidental finding -Patient was started on Paxlovid will continue 5 days treatment -Added vitamin C and zinc, small dose of PO steroids for 3-5 days  -Does not require oxygen,  We will monitor closely  Ataxia Focal neurological deficit including ataxia and slurred speech -Speech back  to normal -Complain of generalized weakness -Patient with unsteady gait, positive Romberg sign. -Continue with CVA/TIA pathway, work-up  -Transferring to [facility] to obtain MRI - 2D echo,  -CT angio: IMPRESSION: 1. 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 No other hemodynamically significant stenosis in the neck. 2. No intracranial large vessel occlusion or significant stenosis. 3. Degenerative changes in the cervical spine with moderate to severe spinal canal stenosis at C4-C5 and C5-C6.   -LDL: 97 -A1c:   -We will continue with aspirin 81 mg oral daily -Finding her less likely attributed to her medication Lyrica or Robaxin, has not recent change in the dosing, renal function is stable -PT/OT/SLP consult  Essential hypertension - We will holding current medication -If CVA ruled out, will resume home meds of HCTZ, Norvasc, losartan  Neuropathy - Currently stable, continue to hold Lyrica In the setting of bursitis neuro evaluation Ruling out acute CVA -We will resume in a.m.    ---------------------------------------------------------------------------------------------------------------------------------------- Nutritional status:  The patient's BMI is: Body mass index is 27.12 kg/m. I agree with the assessment and plan as outlined  Skin Assessment: I have examined the patient's skin and I agree with the wound assessment as performed  by wound care team As outlined belowe:  -----------------------------------------------------------------------------------------------------------------------------------------  DVT prophylaxis:  enoxaparin (LOVENOX) injection 40 mg Start: 11/30/21 1000   Code Status:   Code Status: Full Code  Family Communication: No family member present at bedside- attempt will be made to update daily The above findings and plan of care has been discussed with patient (and family)  in detail,  they expressed understanding and  agreement of above. -Advance care planning has been discussed.   Admission status:   Status is: Observation The patient remains OBS appropriate and will d/c before 2 midnights.     Procedures:   No admission procedures for hospital encounter.   Antimicrobials:  Anti-infectives (From admission, onward)    Start     Dose/Rate Route Frequency Ordered Stop   11/30/21 1000  nirmatrelvir/ritonavir EUA (renal dosing) (PAXLOVID) 2 tablet        2 tablet Oral 2 times daily 11/30/21 0100 12/05/21 0959        Medication:    stroke: early stages of recovery book   Does not apply Once   ascorbic acid  500 mg Oral Daily   aspirin EC  81 mg Oral Daily   clopidogrel  75 mg Oral Daily   enoxaparin (LOVENOX) injection  40 mg Subcutaneous Q24H   famotidine  20 mg Oral Daily   nirmatrelvir/ritonavir EUA (renal dosing)  2 tablet Oral BID   predniSONE  40 mg Oral Q breakfast   pregabalin  100 mg Oral TID   zinc sulfate  220 mg Oral Daily    acetaminophen **OR** acetaminophen (TYLENOL) oral liquid 160 mg/5 mL **OR** acetaminophen   Objective:   Vitals:   11/30/21 0600 11/30/21 0800 11/30/21 0820 11/30/21 0900  BP: (!) 152/68 (!) 127/53  (!) 141/64  Pulse: 65 (!) 59  60  Resp: 18 16  (!) 21  Temp:   98.1 F (36.7 C)   TempSrc:   Oral   SpO2: 97% (!) 87%  97%  Weight:      Height:        Intake/Output Summary (Last 24 hours) at 11/30/2021 1119 Last data filed at 11/30/2021 0055 Gross per 24 hour  Intake --  Output 400 ml  Net -400 ml   Filed Weights   11/29/21 1832  Weight: 90.7 kg     Examination:   Physical Exam  Constitution:  Alert, cooperative, no distress,  Appears calm and comfortable  Psychiatric:   Normal and stable mood and affect, cognition intact,   HEENT:        Normocephalic, PERRL, otherwise with in Normal limits  Chest:         Chest symmetric Cardio vascular:  S1/S2, RRR, No murmure, No Rubs or Gallops  pulmonary: Clear to auscultation  bilaterally, respirations unlabored, negative wheezes / crackles Abdomen: Soft, non-tender, non-distended, bowel sounds,no masses, no organomegaly Muscular skeletal: Limited exam - in bed, able to move all 4 extremities,   Neuro: CNII-XII intact. , normal motor and sensation, reflexes intact  Extremities: No pitting edema lower extremities, +2 pulses  Skin: Dry, warm to touch, negative for any Rashes, No open wounds Wounds: per nursing documentation   ------------------------------------------------------------------------------------------------------------------------------------------    LABs:     Latest Ref Rng & Units 11/29/2021    7:08 PM  CBC  WBC 4.0 - 10.5 K/uL 9.3   Hemoglobin 13.0 - 17.0 g/dL 14.3   Hematocrit 39.0 - 52.0 % 42.2   Platelets 150 - 400 K/uL 140  Latest Ref Rng & Units 11/29/2021    7:08 PM  CMP  Glucose 70 - 99 mg/dL 148   BUN 8 - 23 mg/dL 19   Creatinine 0.61 - 1.24 mg/dL 1.24   Sodium 135 - 145 mmol/L 137   Potassium 3.5 - 5.1 mmol/L 3.0   Chloride 98 - 111 mmol/L 100   CO2 22 - 32 mmol/L 29   Calcium 8.9 - 10.3 mg/dL 8.8        Micro Results Recent Results (from the past 240 hour(s))  Resp Panel by RT-PCR (Flu A&B, Covid) Anterior Nasal Swab     Status: Abnormal   Collection Time: 11/29/21  9:34 PM   Specimen: Anterior Nasal Swab  Result Value Ref Range Status   SARS Coronavirus 2 by RT PCR POSITIVE (A) NEGATIVE Final    Comment: (NOTE) SARS-CoV-2 target nucleic acids are DETECTED.  The SARS-CoV-2 RNA is generally detectable in upper respiratory specimens during the acute phase of infection. Positive results are indicative of the presence of the identified virus, but do not rule out bacterial infection or co-infection with other pathogens not detected by the test. Clinical correlation with patient history and other diagnostic information is necessary to determine patient infection status. The expected result is Negative.  Fact  Sheet for Patients: EntrepreneurPulse.com.au  Fact Sheet for Healthcare Providers: IncredibleEmployment.be  This test is not yet approved or cleared by the [place] FDA and  has been authorized for detection and/or diagnosis of SARS-CoV-2 by FDA under an Emergency Use Authorization (EUA).  This EUA will remain in effect (meaning this test can be used) for the duration of  the COVID-19 declaration under Section 564(b)(1) of the A ct, 21 U.S.C. section 360bbb-3(b)(1), unless the authorization is terminated or revoked sooner.     Influenza A by PCR NEGATIVE NEGATIVE Final   Influenza B by PCR NEGATIVE NEGATIVE Final    Comment: (NOTE) The Xpert Xpress SARS-CoV-2/FLU/RSV plus assay is intended as an aid in the diagnosis of influenza from Nasopharyngeal swab specimens and should not be used as a sole basis for treatment. Nasal washings and aspirates are unacceptable for Xpert Xpress SARS-CoV-2/FLU/RSV testing.  Fact Sheet for Patients: EntrepreneurPulse.com.au  Fact Sheet for Healthcare Providers: IncredibleEmployment.be  This test is not yet approved or cleared by the [place] FDA and has been authorized for detection and/or diagnosis of SARS-CoV-2 by FDA under an Emergency Use Authorization (EUA). This EUA will remain in effect (meaning this test can be used) for the duration of the COVID-19 declaration under Section 564(b)(1) of the Act, 21 U.S.C. section 360bbb-3(b)(1), unless the authorization is terminated or revoked.  Performed at [facility], [address] [place], [place] [address]     Radiology Reports CT ANGIO HEAD NECK W WO CM  Result Date: 11/30/2021 CLINICAL DATA:  Slurred speech, ataxia EXAM: CT ANGIOGRAPHY HEAD AND NECK TECHNIQUE: Multidetector CT imaging of the head and neck was performed using the standard protocol during bolus administration of intravenous contrast.  Multiplanar CT image reconstructions and MIPs were obtained to evaluate the vascular anatomy. Carotid stenosis measurements (when applicable) are obtained utilizing NASCET criteria, using the distal internal carotid diameter as the denominator. RADIATION DOSE REDUCTION: This exam was performed according to the departmental dose-optimization program which includes automated exposure control, adjustment of the mA and/or kV according to patient size and/or use of iterative reconstruction technique. CONTRAST:  142m OMNIPAQUE IOHEXOL 350 MG/ML SOLN COMPARISON:  No prior CTA, correlation made with CT head  11/29/2021 FINDINGS: CT HEAD FINDINGS For noncontrast findings, please see same day CT head. CTA NECK FINDINGS Aortic arch: Standard branching. Imaged portion shows no evidence of aneurysm or dissection. No significant stenosis of the major arch vessel origins. Right carotid system: No evidence of dissection, occlusion, or hemodynamically significant stenosis (greater than 50%). Atherosclerotic disease at the bifurcation and in the proximal ICA is not hemodynamically significant. Left carotid system: 50% stenosis at the origin of the left ICA (series 7, image 94). No dissection or occlusion. Vertebral arteries: No evidence of dissection, occlusion, or hemodynamically significant stenosis (greater than 50%). Skeleton: Acute osseous abnormality. Degenerative changes in the cervical spine. Moderate to severe spinal canal stenosis at C4-C5 and C5-C6. Other neck: Negative. Upper chest: No focal pulmonary opacity or pleural effusion. Review of the MIP images confirms the above findings CTA HEAD FINDINGS Anterior circulation: Both internal carotid arteries are patent to the termini, with calcified and noncalcified plaque but without significant stenosis. A1 segments patent. Normal anterior communicating artery. Anterior cerebral arteries are patent to their distal aspects. No M1 stenosis or occlusion. MCA branches perfused and  symmetric. Posterior circulation: Vertebral arteries patent to the vertebrobasilar junction without stenosis. Posterior inferior cerebellar arteries patent proximally. Basilar patent to its distal aspect. Superior cerebellar arteries patent proximally. Patent P1 segments. Mild stenosis in the left P2 segment. PCAs otherwise perfused to their distal aspects without stenosis. There is likely a diminutive left posterior communicating artery. Venous sinuses: As permitted by contrast timing, patent. Anatomic variants: None significant. Review of the MIP images confirms the above findings IMPRESSION: 1. 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 No other hemodynamically significant stenosis in the neck. 2. No intracranial large vessel occlusion or significant stenosis. 3. Degenerative changes in the cervical spine with moderate to severe spinal canal stenosis at C4-C5 and C5-C6. Electronically Signed   By: Merilyn Baba M.D.   On: 11/30/2021 03:49   CT Head Wo Contrast  Result Date: 11/29/2021 CLINICAL DATA:  Altered level of consciousness, difficulty ambulating, slurred speech for 2 days EXAM: CT HEAD WITHOUT CONTRAST TECHNIQUE: Contiguous axial images were obtained from the base of the skull through the vertex without intravenous contrast. RADIATION DOSE REDUCTION: This exam was performed according to the departmental dose-optimization program which includes automated exposure control, adjustment of the mA and/or kV according to patient size and/or use of iterative reconstruction technique. COMPARISON:  03/19/2006 FINDINGS: Brain: No acute infarct or hemorrhage. Lateral ventricles and midline structures are unremarkable. No acute extra-axial fluid collections. No mass effect. Vascular: No hyperdense vessel or unexpected calcification. Skull: Normal. Negative for fracture or focal lesion. Sinuses/Orbits: No acute finding. Other: None. IMPRESSION: 1. No acute intracranial process. Electronically Signed    By: Randa Ngo M.D.   On: 11/29/2021 20:12    SIGNED: Deatra James, MD, FHM. [facility],  Pager (please use [email or web address] to page/text) Please use Epic Secure Chat for non-urgent communication (7AM-7PM)  If 7PM-7AM, please contact night-coverage [email or web address], 11/30/2021, 11:19 AM

## 2021-11-30 NOTE — Assessment & Plan Note (Addendum)
Focal neurological deficit including ataxia and slurred speech -Speech back to normal -Complain of generalized weakness -Patient with unsteady gait, positive Romberg sign. -Continue with CVA/TIA pathway, work-up  -Transferring to [facility] to obtain MRI - 2D echo,  -CT angio: IMPRESSION: 1. 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 No other hemodynamically significant stenosis in the neck. 2. No intracranial large vessel occlusion or significant stenosis. 3. Degenerative changes in the cervical spine with moderate to severe spinal canal stenosis at C4-C5 and C5-C6.   -LDL: 97 -A1c:   -We will continue with aspirin 81 mg oral daily -Finding her less likely attributed to her medication Lyrica or Robaxin, has not recent change in the dosing, renal function is stable -PT/OT/SLP consult

## 2021-11-30 NOTE — Plan of Care (Signed)
  Problem: Acute Rehab PT Goals(only PT should resolve) Goal: Pt Will Go Supine/Side To Sit Outcome: Progressing Flowsheets (Taken 11/30/2021 1224) Pt will go Supine/Side to Sit: with min guard assist Goal: Pt Will Go Sit To Supine/Side Outcome: Progressing Flowsheets (Taken 11/30/2021 1224) Pt will go Sit to Supine/Side: with modified independence Goal: Patient Will Transfer Sit To/From Stand Outcome: Progressing Flowsheets (Taken 11/30/2021 1224) Patient will transfer sit to/from stand:  with modified independence  with supervision Goal: Pt Will Transfer Bed To Chair/Chair To Bed Outcome: Progressing Flowsheets (Taken 11/30/2021 1224) Pt will Transfer Bed to Chair/Chair to Bed:  with modified independence  with supervision Goal: Pt Will Ambulate Outcome: Progressing Flowsheets (Taken 11/30/2021 1224) Pt will Ambulate:  > 125 feet  with supervision  with least restrictive assistive device Goal: Pt/caregiver will Perform Home Exercise Program Outcome: Progressing Flowsheets (Taken 11/30/2021 1224) Pt/caregiver will Perform Home Exercise Program:  For increased strengthening  For improved balance  Independently  12:25 PM, 11/30/21 Mearl Latin PT, DPT Physical Therapist at [facility]

## 2021-11-30 NOTE — Assessment & Plan Note (Addendum)
-  Incidental finding -Patient was started on Paxlovid will continue 5 days treatment -Added vitamin C and zinc, small dose of PO steroids for 3-5 days  -Does not require oxygen,  We will monitor closely

## 2021-11-30 NOTE — Progress Notes (Signed)
COVID 19 positive, will start on paxlovid. Phillips Climes MD

## 2021-11-30 NOTE — Assessment & Plan Note (Signed)
-   We will holding current medication -If CVA ruled out, will resume home meds of HCTZ, Norvasc, losartan

## 2021-11-30 NOTE — TOC Progression Note (Signed)
Transition of Care [facility]) - Progression Note    Patient Details  Name: Daniel Mathis MRN: [id number] Date of Birth: [birth year]  [facility]) CM/SW Contact  Salome Arnt, [place] Phone Number: 11/30/2021, 1:11 PM  Clinical Narrative: PT evaluated pt and recommend home health. Discussed with pt who is agreeable with no preference on agency. Referred and accepted by Sarah with [facility]. TOC will continue to follow.         Barriers to Discharge: Continued Medical Work up  Expected Discharge Plan and Services                                     HH Arranged: PT   Date [place]: 11/30/21 Time [place]: 1311 Representative spoke with at [place]: Billey Chang   Social Determinants of Health ([place]) Interventions    Readmission Risk Interventions     No data to display

## 2021-11-30 NOTE — Evaluation (Signed)
Physical Therapy Evaluation Patient Details Name: Daniel Mathis MRN: [id number] DOB: September 17, 1931 Today's Date: 11/30/2021  History of Present Illness  Daniel Mathis  is a 88 y.o. male, past medical of hypertension,neuropathy, home by himself, independent, patient was at recent fishing trip went to Wednesday, when he came back on Thursday he was noted by a family friend with multiple surgical deficiencies, he had slurred speech, very unsteady gait, as well patient report he noted these findings, he denies any altered mental status, syncope, any other focal deficits, reports he feels his gait unsteady which is new to him, these findings been at least for last 2 days, he had a fall where he fell and hit the back of his head, denies any loss of consciousness, he denies dysuria, polyuria, but he does report flu symptoms, where he has runny nose, feeling some chills.  -In ED CT head with no acute finding, UA was negative, EKG and telemetry showing normal sinus rhythm, no significant labs abnormalities, but he had unsteady gait for which [facility] hospitalist consulted to admit.   Clinical Impression  Patient limited for functional mobility as stated below secondary to minimal BLE weakness, fatigue and impaired standing balance. Patient requires assist to pull to seated EOB due to weakness and ED bed leg portion unable to be flattened fully. He demonstrates good sitting balance and sitting tolerance at EOB. Patient given min g for safety/balance with transfer to standing and for ambulation in room. Patient ambulates with slow cadence with decreased stride length without loss of balance and minimal use of RW. Patient will benefit from continued physical therapy in hospital and recommended venue below to increase strength, balance, endurance for safe ADLs and gait.        Recommendations for follow up therapy are one component of a multi-disciplinary discharge planning process, led by the attending physician.   Recommendations may be updated based on patient status, additional functional criteria and insurance authorization.  Follow Up Recommendations Home health PT      Assistance Recommended at Discharge Intermittent Supervision/Assistance  Patient can return home with the following  Assistance with cooking/housework    Equipment Recommendations None recommended by PT  Recommendations for Other Services       Functional Status Assessment Patient has had a recent decline in their functional status and demonstrates the ability to make significant improvements in function in a reasonable and predictable amount of time.     Precautions / Restrictions Precautions Precautions: Fall Restrictions Weight Bearing Restrictions: No      Mobility  Bed Mobility Overal bed mobility: Needs Assistance Bed Mobility: Supine to Sit, Sit to Supine     Supine to sit: Min assist, Mod assist, HOB elevated Sit to supine: Modified independent (Device/Increase time)   General bed mobility comments: slow, labored transition to seated EOB; assist to pull to seated    Transfers Overall transfer level: Needs assistance Equipment used: Rolling walker (2 wheels) Transfers: Sit to/from Stand Sit to Stand: Min guard                Ambulation/Gait Ambulation/Gait assistance: Min guard Gait Distance (Feet): 40 Feet Assistive device: Rolling walker (2 wheels) Gait Pattern/deviations: Decreased stride length, Step-through pattern Gait velocity: decreased     General Gait Details: slow cadence with small steps with use of RW  Stairs            Wheelchair Mobility    Modified Rankin (Stroke Patients Only)       Balance  Pertinent Vitals/Pain Pain Assessment Pain Assessment: No/denies pain    Home Living Family/patient expects to be discharged to:: Private residence Living Arrangements: Alone Available Help at  Discharge: Family;Friend(s);Available PRN/intermittently Type of Home: House Home Access: Level entry       Home Layout: One level Home Equipment: Conservation officer, nature (2 wheels)      Prior Function Prior Level of Function : Independent/Modified Independent             Mobility Comments: states community ambulation without AD       Hand Dominance        Extremity/Trunk Assessment   Upper Extremity Assessment Upper Extremity Assessment: Overall WFL for tasks assessed    Lower Extremity Assessment Lower Extremity Assessment: Overall WFL for tasks assessed    Cervical / Trunk Assessment Cervical / Trunk Assessment: Normal  Communication   Communication: No difficulties;HOH  Cognition Arousal/Alertness: Awake/alert Behavior During Therapy: WFL for tasks assessed/performed Overall Cognitive Status: Within Functional Limits for tasks assessed                                          General Comments      Exercises     Assessment/Plan    PT Assessment Patient needs continued PT services  PT Problem List Decreased strength;Decreased activity tolerance;Decreased balance;Decreased mobility       PT Treatment Interventions DME instruction;Gait training;Stair training;Functional mobility training;Therapeutic activities;Therapeutic exercise;Balance training;Neuromuscular re-education;Patient/family education;Manual techniques    PT Goals (Current goals can be found in the Care Plan section)  Acute Rehab PT Goals Patient Stated Goal: return home PT Goal Formulation: With patient Time For Goal Achievement: 12/14/21 Potential to Achieve Goals: Good    Frequency Min 3X/week     Co-evaluation               AM-PAC PT "6 Clicks" Mobility  Outcome Measure Help needed turning from your back to your side while in a flat bed without using bedrails?: None Help needed moving from lying on your back to sitting on the side of a flat bed without using  bedrails?: A Little Help needed moving to and from a bed to a chair (including a wheelchair)?: A Little Help needed standing up from a chair using your arms (e.g., wheelchair or bedside chair)?: A Little Help needed to walk in hospital room?: A Little Help needed climbing 3-5 steps with a railing? : A Little 6 Click Score: 19    End of Session Equipment Utilized During Treatment: Gait belt Activity Tolerance: Patient tolerated treatment well Patient left: in bed;with call bell/phone within reach Nurse Communication: Mobility status PT Visit Diagnosis: Unsteadiness on feet (R26.81);Other abnormalities of gait and mobility (R26.89);Muscle weakness (generalized) (M62.81);Other symptoms and signs involving the nervous system (R29.898)    Time: 1791-5056 PT Time Calculation (min) (ACUTE ONLY): 29 min   Charges:   PT Evaluation $PT Eval Low Complexity: 1 Low PT Treatments $Therapeutic Activity: 23-37 mins        12:22 PM, 11/30/21 Mearl Latin PT, DPT Physical Therapist at [facility]

## 2021-11-30 NOTE — Care Management Obs Status (Signed)
[place] NOTIFICATION   Patient Details  Name: Daniel Mathis MRN: [id number] Date of Birth: 11-14-1931   Medicare Observation Status Notification Given:  Yes    Salome Arnt, LCSW 11/30/2021, 10:59 AM

## 2021-12-01 DIAGNOSIS — R531 Weakness: Secondary | ICD-10-CM | POA: Diagnosis not present

## 2021-12-01 LAB — BASIC METABOLIC PANEL
Anion gap: 6 (ref 5–15)
BUN: 18 mg/dL (ref 8–23)
CO2: 26 mmol/L (ref 22–32)
Calcium: 8.2 mg/dL — ABNORMAL LOW (ref 8.9–10.3)
Chloride: 106 mmol/L (ref 98–111)
Creatinine, Ser: 0.95 mg/dL (ref 0.61–1.24)
GFR, Estimated: >60 mL/min (ref >60)
Glucose, Bld: 131 mg/dL — ABNORMAL HIGH (ref 70–99)
Potassium: 3.2 mmol/L — ABNORMAL LOW (ref 3.5–5.1)
Sodium: 138 mmol/L (ref 135–145)

## 2021-12-01 MED ORDER — NIRMATRELVIR/RITONAVIR (PAXLOVID) TABLET (RENAL DOSING): 2.0000 | ORAL_TABLET | Freq: Two times a day (BID) | ORAL | 0 refills | Status: AC

## 2021-12-01 MED ORDER — ASPIRIN 81 MG PO TBEC: 81.0000 mg | DELAYED_RELEASE_TABLET | Freq: Every day | ORAL | 12 refills | Status: AC

## 2021-12-01 NOTE — Progress Notes (Signed)
Discharge instructions given to patient. Patient verbalized understanding Discharged by private vehicle via wheelchair.

## 2021-12-01 NOTE — Plan of Care (Signed)
  Problem: Self-Care: Goal: Ability to participate in self-care as condition permits will improve Outcome: Not Progressing   Problem: Nutrition: Goal: Risk of aspiration will decrease Outcome: Not Progressing   Problem: Clinical Measurements: Goal: Will remain free from infection Outcome: Not Progressing   Problem: Clinical Measurements: Goal: Respiratory complications will improve Outcome: Not Progressing

## 2021-12-01 NOTE — Progress Notes (Signed)
Informed granddaughter listed in chart Erica of pending discharge awaiting code 85 to be completed before DC. Informed MD she wanted to speak with him via secure chat. No response from MD.

## 2021-12-01 NOTE — Care Management CC44 (Signed)
Condition Code 44 Documentation Completed  Patient Details  Name: Daniel Mathis MRN: [id number] Date of Birth: 02/20/1931   Condition Code 44 given:  Yes Patient signature on Condition Code 44 notice:  Yes Documentation of 2 MD's agreement:  Yes Code 44 added to claim:  Yes    Boneta Lucks, RN 12/01/2021, 12:37 PM

## 2021-12-01 NOTE — Discharge Summary (Signed)
Physician Discharge Summary   Patient: Daniel Mathis MRN: [id number] DOB: 07-14-1931  Admit date:     11/29/2021  Discharge date: 12/01/21  Discharge Physician: Deatra James   PCP: Asencion Noble, MD   Recommendations at discharge:   Follow-up with the PCP within 1 weeks Further work-up for diabetes postinfection Recommending daily aspirin Continue to complete the course of Paxlovid-for SARS-CoV-2  Discharge Diagnoses: Principal Problem:   Weakness Active Problems:   SARS-CoV-2 positive   Neuropathy   Essential hypertension   Ataxia  Resolved Problems:   * No resolved hospital problems. *  Hospital Course:  Daniel Mathis  is a 88 y.o. male, past medical of hypertension,neuropathy, home by himself, independent, patient was at recent fishing trip went to Wednesday, when he came back on Thursday he was noted by a family friend with multiple surgical deficiencies, he had slurred speech, very unsteady gait, as well patient report he noted these findings, he denies any altered mental status, syncope, any other focal deficits, reports he feels his gait unsteady which is new to him, these findings been at least for last 2 days, he had a fall where he fell and hit the back of his head, denies any loss of consciousness, he denies dysuria, polyuria, but he does report flu symptoms, where he has runny nose, feeling some chills. -In ED CT head with no acute finding, UA was negative, EKG and telemetry showing normal sinus rhythm, no significant labs abnormalities, but he had unsteady gait for which [facility] hospitalist consulted to admit.   SARS-CoV-2 positive -Incidental finding -Patient was started on Paxlovid -to continue for total of  5 days treatment -Added vitamin C and zinc, small dose of PO steroids  -Discontinue steroids for mild hyperglycemia,  -Does not require oxygen,   Ataxia Focal neurological deficit including ataxia and slurred speech -resolved -Recommended to continue  daily aspirin  -All imaging negative for acute CVA   - 2D echo, EEG GAF 78-46%, grade 2 diastolic dysfunction, normal LV function -CT angio: IMPRESSION: 1. 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 No other hemodynamically significant stenosis in the neck. 2. No intracranial large vessel occlusion or significant stenosis. 3. Degenerative changes in the cervical spine with moderate to severe spinal canal stenosis at C4-C5 and C5-C6.   -LDL: 97 -A1c: 6.9,, last CBG 148, 131  -We will continue with aspirin 81 mg oral daily -Finding her less likely attributed to her medication Lyrica or Robaxin, has not recent change in the dosing, renal function is stable -PT/OT/SLP -patient back to baseline no further follow-up    Possible new diagnosis of diabetes mellitus  -A1c 6.9 -Last CBG 148, 131 in the setting p.o. steroids -Discussed with granddaughter and the patient -Not initiating p.o. diabetic medication at this point Patient is to follow-up with PCP -postinfection, reevaluation for diabetes and possible initiation of medication  Essential hypertension -If CVA ruled out, will resume home meds of HCTZ, Norvasc, losartan  Neuropathy -Resuming home medication of Lyrica     Consultants: Teleneurology Procedures performed: CT, CTA, MRI of the brain, 2D echocardiogram Disposition: Home Diet recommendation:  Discharge Diet Orders (From admission, onward)     Start     Ordered   12/01/21 0000  Diet - low sodium heart healthy        12/01/21 1209           Cardiac and Carb modified diet DISCHARGE MEDICATION: Allergies as of 12/01/2021   No Known Allergies  Medication List     TAKE these medications    amLODipine 2.5 MG tablet Commonly known as: NORVASC Take 1 tablet by mouth daily.   aspirin EC 81 MG tablet Take 1 tablet (81 mg total) by mouth daily. Swallow whole. Start taking on: December 02, 2021   hydrochlorothiazide 25 MG  tablet Commonly known as: HYDRODIURIL Take 25 mg by mouth daily.   losartan 100 MG tablet Commonly known as: COZAAR Take 100 mg by mouth daily.   nirmatrelvir/ritonavir EUA (renal dosing) 10 x 150 MG & 10 x '100MG'$  Tabs Commonly known as: PAXLOVID Take 2 tablets by mouth 2 (two) times daily for 3 days. Patient GFR is 60 . Take nirmatrelvir (150 mg) one tablet twice daily for 5 days and ritonavir (100 mg) one tablet twice daily for 5 days.   pregabalin 100 MG capsule Commonly known as: LYRICA Take 100 mg by mouth 3 (three) times daily.        Follow-up Information     [place] Follow up.   Why: Will contact you to schedule home health visits.               Discharge Exam: Filed Weights   11/29/21 1832  Weight: 90.7 kg      Physical Exam:   General:  AAO x 3,  cooperative, no distress;   HEENT:  Normocephalic, PERRL, otherwise with in Normal limits   Neuro:  CNII-XII intact. , normal motor and sensation, reflexes intact   Lungs:   Clear to auscultation BL, Respirations unlabored,  No wheezes / crackles  Cardio:    S1/S2, RRR, No murmure, No Rubs or Gallops   Abdomen:  Soft, non-tender, bowel sounds active all four quadrants, no guarding or peritoneal signs.  Muscular  skeletal:  Limited exam -global generalized weaknesses - in bed, able to move all 4 extremities,   2+ pulses,  symmetric, No pitting edema  Skin:  Dry, warm to touch, negative for any Rashes,  Wounds: Please see nursing documentation          Condition at discharge: good  The results of significant diagnostics from this hospitalization (including imaging, microbiology, ancillary and laboratory) are listed below for reference.   Imaging Studies: ECHOCARDIOGRAM COMPLETE  Result Date: 11/30/2021    ECHOCARDIOGRAM REPORT   Patient Name:   Daniel Mathis Date of Exam: 11/30/2021 Medical Rec #:  [id number]       Height:       72.0 in Accession #:    [id number]      Weight:        200.0 lb Date of Birth:  July 20, 1931       BSA:          2.131 m Patient Age:    88 years        BP:           141/64 mmHg Patient Gender: M               HR:           70 bpm. Exam Location:  [facility] Procedure: 2D Echo, 3D Echo, Cardiac Doppler, Color Doppler and Strain Analysis Indications:    TIA  History:        Patient has no prior history of Echocardiogram examinations.                 Risk Factors:Non-Smoker and Hypertension. SARS-CoV-2 positive.  Sonographer:    Leavy Cella RDCS Referring  Phys: [address] IMPRESSIONS  1. Left ventricular ejection fraction, by estimation, is 65 to 70%. The left ventricle has normal function. The left ventricle has no regional wall motion abnormalities. There is mild left ventricular hypertrophy. Left ventricular diastolic parameters are consistent with Grade I diastolic dysfunction (impaired relaxation).  2. Right ventricular systolic function is normal. The right ventricular size is normal. There is normal pulmonary artery systolic pressure. The estimated right ventricular systolic pressure is 27.5 mmHg.  3. The mitral valve is abnormal. Trivial mitral valve regurgitation.  4. The aortic valve is tricuspid. Aortic valve regurgitation is not visualized. Aortic valve sclerosis is present, with no evidence of aortic valve stenosis.  5. The inferior vena cava is normal in size with greater than 50% respiratory variability, suggesting right atrial pressure of 3 mmHg. Comparison(s): No prior Echocardiogram. FINDINGS  Left Ventricle: Left ventricular ejection fraction, by estimation, is 65 to 70%. The left ventricle has normal function. The left ventricle has no regional wall motion abnormalities. The left ventricular internal cavity size was normal in size. There is  mild left ventricular hypertrophy. Left ventricular diastolic parameters are consistent with Grade I diastolic dysfunction (impaired relaxation). Indeterminate filling pressures. Right Ventricle:  The right ventricular size is normal. No increase in right ventricular wall thickness. Right ventricular systolic function is normal. There is normal pulmonary artery systolic pressure. The tricuspid regurgitant velocity is 1.52 m/s, and  with an assumed right atrial pressure of 3 mmHg, the estimated right ventricular systolic pressure is 17.0 mmHg. Left Atrium: Left atrial size was normal in size. Right Atrium: Right atrial size was normal in size. Pericardium: There is no evidence of pericardial effusion. Mitral Valve: The mitral valve is abnormal. There is mild calcification of the mitral valve leaflet(s). Trivial mitral valve regurgitation. Tricuspid Valve: The tricuspid valve is grossly normal. Tricuspid valve regurgitation is trivial. Aortic Valve: The aortic valve is tricuspid. Aortic valve regurgitation is not visualized. Aortic valve sclerosis is present, with no evidence of aortic valve stenosis. Pulmonic Valve: The pulmonic valve was normal in structure. Pulmonic valve regurgitation is not visualized. Aorta: The aortic root and ascending aorta are structurally normal, with no evidence of dilitation. Venous: The inferior vena cava is normal in size with greater than 50% respiratory variability, suggesting right atrial pressure of 3 mmHg. IAS/Shunts: No atrial level shunt detected by color flow Doppler.  LEFT VENTRICLE PLAX 2D LVIDd:         4.30 cm   Diastology LVIDs:         2.40 cm   LV e' medial:    7.51 cm/s LV PW:         1.40 cm   LV E/e' medial:  11.5 LV IVS:        1.10 cm   LV e' lateral:   10.20 cm/s LVOT diam:     2.10 cm   LV E/e' lateral: 8.5 LV SV:         80 LV SV Index:   38 LVOT Area:     3.46 cm  RIGHT VENTRICLE RV Basal diam:  5.00 cm RV Mid diam:    3.80 cm RV S prime:     16.40 cm/s TAPSE (M-mode): 3.6 cm LEFT ATRIUM           Index        RIGHT ATRIUM           Index LA diam:      3.00 cm 1.41  cm/m   RA Area:     20.90 cm LA Vol (A4C): 49.1 ml 23.05 ml/m  RA Volume:   57.60 ml   27.04 ml/m  AORTIC VALVE LVOT Vmax:   123.00 cm/s LVOT Vmean:  77.100 cm/s LVOT VTI:    0.231 m  AORTA Ao Root diam: 3.20 cm Ao Asc diam:  3.70 cm MITRAL VALVE               TRICUSPID VALVE MV Area (PHT): 4.10 cm    TR Peak grad:   9.2 mmHg MV Decel Time: 185 msec    TR Vmax:        152.00 cm/s MV E velocity: 86.50 cm/s MV A velocity: 84.40 cm/s  SHUNTS MV E/A ratio:  1.02        Systemic VTI:  0.23 m                            Systemic Diam: 2.10 cm Lyman Bishop MD Electronically signed by Lyman Bishop MD Signature Date/Time: 11/30/2021/4:40:01 PM    Final    MR BRAIN WO CONTRAST  Result Date: 11/30/2021 CLINICAL DATA:  Tremor ataxia EXAM: MRI HEAD WITHOUT CONTRAST TECHNIQUE: Multiplanar, multiecho pulse sequences of the brain and surrounding structures were obtained without intravenous contrast. COMPARISON:  CT head November 10, 23. FINDINGS: Brain: No acute infarction, hemorrhage, hydrocephalus, extra-axial collection or mass lesion. Vascular: Major arterial flow voids are maintained at the skull base. Skull and upper cervical spine: Normal marrow signal. Sinuses/Orbits: Paranasal sinus mucosal thickening. No acute orbital findings. Other: Trace right mastoid effusion. IMPRESSION: No evidence of acute intracranial abnormality. Electronically Signed   By: Margaretha Sheffield M.D.   On: 11/30/2021 16:13   CT ANGIO HEAD NECK W WO CM  Result Date: 11/30/2021 CLINICAL DATA:  Slurred speech, ataxia EXAM: CT ANGIOGRAPHY HEAD AND NECK TECHNIQUE: Multidetector CT imaging of the head and neck was performed using the standard protocol during bolus administration of intravenous contrast. Multiplanar CT image reconstructions and MIPs were obtained to evaluate the vascular anatomy. Carotid stenosis measurements (when applicable) are obtained utilizing NASCET criteria, using the distal internal carotid diameter as the denominator. RADIATION DOSE REDUCTION: This exam was performed according to the departmental  dose-optimization program which includes automated exposure control, adjustment of the mA and/or kV according to patient size and/or use of iterative reconstruction technique. CONTRAST:  122m OMNIPAQUE IOHEXOL 350 MG/ML SOLN COMPARISON:  No prior CTA, correlation made with CT head 11/29/2021 FINDINGS: CT HEAD FINDINGS For noncontrast findings, please see same day CT head. CTA NECK FINDINGS Aortic arch: Standard branching. Imaged portion shows no evidence of aneurysm or dissection. No significant stenosis of the major arch vessel origins. Right carotid system: No evidence of dissection, occlusion, or hemodynamically significant stenosis (greater than 50%). Atherosclerotic disease at the bifurcation and in the proximal ICA is not hemodynamically significant. Left carotid system: 50% stenosis at the origin of the left ICA (series 7, image 94). No dissection or occlusion. Vertebral arteries: No evidence of dissection, occlusion, or hemodynamically significant stenosis (greater than 50%). Skeleton: Acute osseous abnormality. Degenerative changes in the cervical spine. Moderate to severe spinal canal stenosis at C4-C5 and C5-C6. Other neck: Negative. Upper chest: No focal pulmonary opacity or pleural effusion. Review of the MIP images confirms the above findings CTA HEAD FINDINGS Anterior circulation: Both internal carotid arteries are patent to the termini, with calcified and noncalcified plaque but without significant stenosis. A1  segments patent. Normal anterior communicating artery. Anterior cerebral arteries are patent to their distal aspects. No M1 stenosis or occlusion. MCA branches perfused and symmetric. Posterior circulation: Vertebral arteries patent to the vertebrobasilar junction without stenosis. Posterior inferior cerebellar arteries patent proximally. Basilar patent to its distal aspect. Superior cerebellar arteries patent proximally. Patent P1 segments. Mild stenosis in the left P2 segment. PCAs  otherwise perfused to their distal aspects without stenosis. There is likely a diminutive left posterior communicating artery. Venous sinuses: As permitted by contrast timing, patent. Anatomic variants: None significant. Review of the MIP images confirms the above findings IMPRESSION: 1. 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 No other hemodynamically significant stenosis in the neck. 2. No intracranial large vessel occlusion or significant stenosis. 3. Degenerative changes in the cervical spine with moderate to severe spinal canal stenosis at C4-C5 and C5-C6. Electronically Signed   By: Merilyn Baba M.D.   On: 11/30/2021 03:49   CT Head Wo Contrast  Result Date: 11/29/2021 CLINICAL DATA:  Altered level of consciousness, difficulty ambulating, slurred speech for 2 days EXAM: CT HEAD WITHOUT CONTRAST TECHNIQUE: Contiguous axial images were obtained from the base of the skull through the vertex without intravenous contrast. RADIATION DOSE REDUCTION: This exam was performed according to the departmental dose-optimization program which includes automated exposure control, adjustment of the mA and/or kV according to patient size and/or use of iterative reconstruction technique. COMPARISON:  03/19/2006 FINDINGS: Brain: No acute infarct or hemorrhage. Lateral ventricles and midline structures are unremarkable. No acute extra-axial fluid collections. No mass effect. Vascular: No hyperdense vessel or unexpected calcification. Skull: Normal. Negative for fracture or focal lesion. Sinuses/Orbits: No acute finding. Other: None. IMPRESSION: 1. No acute intracranial process. Electronically Signed   By: Randa Ngo M.D.   On: 11/29/2021 20:12    Microbiology: Results for orders placed or performed during the hospital encounter of 11/29/21  Resp Panel by RT-PCR (Flu A&B, Covid) Anterior Nasal Swab     Status: Abnormal   Collection Time: 11/29/21  9:34 PM   Specimen: Anterior Nasal Swab  Result  Value Ref Range Status   SARS Coronavirus 2 by RT PCR POSITIVE (A) NEGATIVE Final    Comment: (NOTE) SARS-CoV-2 target nucleic acids are DETECTED.  The SARS-CoV-2 RNA is generally detectable in upper respiratory specimens during the acute phase of infection. Positive results are indicative of the presence of the identified virus, but do not rule out bacterial infection or co-infection with other pathogens not detected by the test. Clinical correlation with patient history and other diagnostic information is necessary to determine patient infection status. The expected result is Negative.  Fact Sheet for Patients: EntrepreneurPulse.com.au  Fact Sheet for Healthcare Providers: IncredibleEmployment.be  This test is not yet approved or cleared by the [place] FDA and  has been authorized for detection and/or diagnosis of SARS-CoV-2 by FDA under an Emergency Use Authorization (EUA).  This EUA will remain in effect (meaning this test can be used) for the duration of  the COVID-19 declaration under Section 564(b)(1) of the A ct, 21 U.S.C. section 360bbb-3(b)(1), unless the authorization is terminated or revoked sooner.     Influenza A by PCR NEGATIVE NEGATIVE Final   Influenza B by PCR NEGATIVE NEGATIVE Final    Comment: (NOTE) The Xpert Xpress SARS-CoV-2/FLU/RSV plus assay is intended as an aid in the diagnosis of influenza from Nasopharyngeal swab specimens and should not be used as a sole basis for treatment. Nasal washings and aspirates  are unacceptable for Xpert Xpress SARS-CoV-2/FLU/RSV testing.  Fact Sheet for Patients: EntrepreneurPulse.com.au  Fact Sheet for Healthcare Providers: IncredibleEmployment.be  This test is not yet approved or cleared by the [place] FDA and has been authorized for detection and/or diagnosis of SARS-CoV-2 by FDA under an Emergency Use Authorization (EUA). This EUA  will remain in effect (meaning this test can be used) for the duration of the COVID-19 declaration under Section 564(b)(1) of the Act, 21 U.S.C. section 360bbb-3(b)(1), unless the authorization is terminated or revoked.  Performed at [facility], [address] [place], [place] [address]     Labs: CBC: Recent Labs  Lab 11/29/21 1908  WBC 9.3  HGB 14.3  HCT 42.2  MCV 88.8  PLT 711*   Basic Metabolic Panel: Recent Labs  Lab 11/29/21 1908 12/01/21 0737  NA 137 138  K 3.0* 3.2*  CL 100 106  CO2 29 26  GLUCOSE 148* 131*  BUN 19 18  CREATININE 1.24 0.95  CALCIUM 8.8* 8.2*   Liver Function Tests: No results for input(s): "AST", "ALT", "ALKPHOS", "BILITOT", "PROT", "ALBUMIN" in the last 168 hours. CBG: No results for input(s): "GLUCAP" in the last 168 hours.  Discharge time spent: greater than 30 minutes.  Signed: Deatra James, MD [facility] 12/01/2021

## 2021-12-01 NOTE — Progress Notes (Signed)
SLP Cancellation Note  Patient Details Name: Daniel Mathis MRN: [id number] DOB: [birth year]   Cancelled treatment:       Reason Eval/Treat Not Completed: SLP screened, no needs identified, will sign off; SLP screened Pt in room. Pt denies any changes in swallowing, speech, language, or cognition. MRI negative for acute changes. SLE will be deferred at this time. Reconsult if indicated. SLP will sign off.   Thank you,  Genene Churn, [place]    [place] 12/01/2021, 11:38 AM

## 2021-12-01 NOTE — Care Management Obs Status (Signed)
[place] NOTIFICATION   Patient Details  Name: Daniel Mathis MRN: [id number] Date of Birth: Apr 03, 1931   Medicare Observation Status Notification Given:  Yes    Boneta Lucks, RN 12/01/2021, 12:37 PM

## 2021-12-04 DIAGNOSIS — U071 COVID-19: Secondary | ICD-10-CM | POA: Diagnosis not present

## 2021-12-04 DIAGNOSIS — E876 Hypokalemia: Secondary | ICD-10-CM | POA: Diagnosis not present

## 2021-12-04 DIAGNOSIS — Z7982 Long term (current) use of aspirin: Secondary | ICD-10-CM | POA: Diagnosis not present

## 2021-12-04 DIAGNOSIS — I119 Hypertensive heart disease without heart failure: Secondary | ICD-10-CM | POA: Diagnosis not present

## 2021-12-04 DIAGNOSIS — I083 Combined rheumatic disorders of mitral, aortic and tricuspid valves: Secondary | ICD-10-CM | POA: Diagnosis not present

## 2021-12-04 DIAGNOSIS — G629 Polyneuropathy, unspecified: Secondary | ICD-10-CM | POA: Diagnosis not present

## 2021-12-04 DIAGNOSIS — R270 Ataxia, unspecified: Secondary | ICD-10-CM | POA: Diagnosis not present

## 2021-12-04 DIAGNOSIS — Z9181 History of falling: Secondary | ICD-10-CM | POA: Diagnosis not present

## 2021-12-17 DIAGNOSIS — E1140 Type 2 diabetes mellitus with diabetic neuropathy, unspecified: Secondary | ICD-10-CM | POA: Diagnosis not present

## 2021-12-17 DIAGNOSIS — U071 COVID-19: Secondary | ICD-10-CM | POA: Diagnosis not present

## 2021-12-17 DIAGNOSIS — R7309 Other abnormal glucose: Secondary | ICD-10-CM | POA: Diagnosis not present

## 2021-12-19 DIAGNOSIS — Z23 Encounter for immunization: Secondary | ICD-10-CM | POA: Diagnosis not present

## 2021-12-27 DIAGNOSIS — M5442 Lumbago with sciatica, left side: Secondary | ICD-10-CM | POA: Diagnosis not present

## 2021-12-27 DIAGNOSIS — M9902 Segmental and somatic dysfunction of thoracic region: Secondary | ICD-10-CM | POA: Diagnosis not present

## 2021-12-27 DIAGNOSIS — M9903 Segmental and somatic dysfunction of lumbar region: Secondary | ICD-10-CM | POA: Diagnosis not present

## 2021-12-27 DIAGNOSIS — M9905 Segmental and somatic dysfunction of pelvic region: Secondary | ICD-10-CM | POA: Diagnosis not present

## 2022-01-01 DIAGNOSIS — M9902 Segmental and somatic dysfunction of thoracic region: Secondary | ICD-10-CM | POA: Diagnosis not present

## 2022-01-01 DIAGNOSIS — M9905 Segmental and somatic dysfunction of pelvic region: Secondary | ICD-10-CM | POA: Diagnosis not present

## 2022-01-01 DIAGNOSIS — M5442 Lumbago with sciatica, left side: Secondary | ICD-10-CM | POA: Diagnosis not present

## 2022-01-01 DIAGNOSIS — M9903 Segmental and somatic dysfunction of lumbar region: Secondary | ICD-10-CM | POA: Diagnosis not present

## 2022-01-03 DIAGNOSIS — H6120 Impacted cerumen, unspecified ear: Secondary | ICD-10-CM | POA: Diagnosis not present

## 2022-01-25 ENCOUNTER — Other Ambulatory Visit: Payer: Self-pay

## 2022-01-25 ENCOUNTER — Encounter (HOSPITAL_COMMUNITY): Payer: Self-pay

## 2022-01-25 ENCOUNTER — Emergency Department (HOSPITAL_COMMUNITY)
Admission: EM | Admit: 2022-01-25 | Discharge: 2022-01-25 | Disposition: A | Discharge status: Home / Self Care | Payer: Medicare HMO | Attending: Emergency Medicine | Admitting: Emergency Medicine

## 2022-01-25 DIAGNOSIS — Z7982 Long term (current) use of aspirin: Secondary | ICD-10-CM | POA: Insufficient documentation

## 2022-01-25 DIAGNOSIS — H578A2 Foreign body sensation, left eye: Secondary | ICD-10-CM

## 2022-01-25 DIAGNOSIS — T1502XA Foreign body in cornea, left eye, initial encounter: Secondary | ICD-10-CM | POA: Diagnosis not present

## 2022-01-25 MED ORDER — TETRACAINE HCL 0.5 % OP SOLN
2.0000 [drp] | Freq: Once | OPHTHALMIC | Status: AC
  Administered 2022-01-25: 2 [drp] via OPHTHALMIC
  Filled 2022-01-25: qty 4

## 2022-01-25 MED ORDER — FLUORESCEIN SODIUM 1 MG OP STRP
1.0000 | ORAL_STRIP | Freq: Once | OPHTHALMIC | Status: AC
  Administered 2022-01-25: 1 via OPHTHALMIC
  Filled 2022-01-25: qty 1

## 2022-01-25 NOTE — Discharge Instructions (Signed)
Your eye exam is reassuring today.  I do not find an injury to your eye such as a corneal abrasion, I also have not found a foreign body, however it is possible there was a piece of fuzz in your eye but appears to now be removed.  Hopefully your symptoms have completely resolved.  If not I recommend seeing an eye doctor.  Dr. Stacie Glaze is our ophthalmologist for today I have given you her information, call her for recheck of your eye if you have any continued problems or concerns.

## 2022-01-25 NOTE — ED Triage Notes (Signed)
Patient states he woke with redness/irritation to left eye. Believes that he has something in it. States that he tried to wash it out without success.

## 2022-01-25 NOTE — ED Provider Notes (Addendum)
[facility] EMERGENCY DEPARTMENT Provider Note   CSN: [id number] Arrival date & time: 01/25/22  1023     History {Add pertinent medical, surgical, social history, OB history to HPI:1} Chief Complaint  Patient presents with   Eye Problem    Daniel Mathis is a 89 y.o. male.  The history is provided by the patient.       Home Medications Prior to Admission medications   Medication Sig Start Date End Date Taking? Authorizing Provider  amLODipine (NORVASC) 2.5 MG tablet Take 1 tablet by mouth daily. 07/17/17   [provider]  aspirin EC 81 MG tablet Take 1 tablet (81 mg total) by mouth daily. Swallow whole. 12/02/21   Shahmehdi, Valeria Batman, MD  hydrochlorothiazide (HYDRODIURIL) 25 MG tablet Take 25 mg by mouth daily. 11/25/21   [provider]  losartan (COZAAR) 100 MG tablet Take 100 mg by mouth daily. 09/30/21   [provider]  pregabalin (LYRICA) 100 MG capsule Take 100 mg by mouth 3 (three) times daily. 11/12/21   [provider]      Allergies    Patient has no known allergies.    Review of Systems   Review of Systems  Physical Exam Updated Vital Signs BP (!) 164/70 (BP Location: Right Arm)   Pulse (!) 57   Temp 97.9 F (36.6 C) (Oral)   Resp 18   Ht 6' (1.829 m)   Wt 90.7 kg   SpO2 100%   BMI 27.12 kg/m  Physical Exam Vitals and nursing note reviewed.  Constitutional:      Appearance: He is well-developed.  HENT:     Head: Normocephalic and atraumatic.  Eyes:     General: Lids are normal. Lids are everted, no foreign bodies appreciated.        Right eye: No discharge.        Left eye: No discharge.     Pupils: Pupils are equal, round, and reactive to light.     Left eye: No corneal abrasion or fluorescein uptake.     Slit lamp exam:    Left eye: Anterior chamber quiet.     Comments: Mild conjunctival injection of the left eye.   Visual Acuity Bilateral Near: 20/40 R Near: 20/40 L Near: 20/40    Cardiovascular:      Rate and Rhythm: Normal rate and regular rhythm.     Heart sounds: Normal heart sounds.  Pulmonary:     Effort: Pulmonary effort is normal.     Breath sounds: Normal breath sounds. No wheezing.  Abdominal:     General: Bowel sounds are normal.     Palpations: Abdomen is soft.     Tenderness: There is no abdominal tenderness.  Musculoskeletal:        General: Normal range of motion.     Cervical back: Normal range of motion.  Skin:    General: Skin is warm and dry.  Neurological:     Mental Status: He is alert.     ED Results / Procedures / Treatments   Labs (all labs ordered are listed, but only abnormal results are displayed) Labs Reviewed - No data to display  EKG None  Radiology No results found.  Procedures Procedures  {Document cardiac monitor, telemetry assessment procedure when appropriate:1}  Medications Ordered in ED Medications  tetracaine (PONTOCAINE) 0.5 % ophthalmic solution 2 drop (2 drops Left Eye Given by Other 01/25/22 1107)  fluorescein ophthalmic strip 1 strip (1 strip Left Eye Given  by Other 01/25/22 1107)    ED Course/ Medical Decision Making/ A&P                           Medical Decision Making Risk Prescription drug management.     {Document critical care time when appropriate:1} {Document review of labs and clinical decision tools ie heart score, Chads2Vasc2 etc:1}  {Document your independent review of radiology images, and any outside records:1} {Document your discussion with family members, caretakers, and with consultants:1} {Document social determinants of health affecting pt's care:1} {Document your decision making why or why not admission, treatments were needed:1} Final Clinical Impression(s) / ED Diagnoses Final diagnoses:  None    Rx / DC Orders ED Discharge Orders     None

## 2022-01-25 NOTE — ED Notes (Signed)
Pt ambulated to BR and back to room with steady gait. 

## 2022-01-25 NOTE — ED Notes (Signed)
ED Provider at bedside. 

## 2022-02-07 ENCOUNTER — Telehealth: Payer: Self-pay | Admitting: *Deleted

## 2022-02-07 NOTE — Telephone Encounter (Signed)
        Patient  visited [facility] ed on 02/07/2022  for eye pain    Telephone encounter attempt :  1st  No answer [place] [phone] [address] , [place] [address] Email : Daniel [email or web address]

## 2022-03-14 DIAGNOSIS — E1140 Type 2 diabetes mellitus with diabetic neuropathy, unspecified: Secondary | ICD-10-CM | POA: Diagnosis not present

## 2022-03-21 DIAGNOSIS — R7309 Other abnormal glucose: Secondary | ICD-10-CM | POA: Diagnosis not present

## 2022-03-21 DIAGNOSIS — I10 Essential (primary) hypertension: Secondary | ICD-10-CM | POA: Diagnosis not present

## 2022-03-21 DIAGNOSIS — E1169 Type 2 diabetes mellitus with other specified complication: Secondary | ICD-10-CM | POA: Diagnosis not present

## 2022-05-07 DIAGNOSIS — Z85828 Personal history of other malignant neoplasm of skin: Secondary | ICD-10-CM | POA: Diagnosis not present

## 2022-05-07 DIAGNOSIS — L570 Actinic keratosis: Secondary | ICD-10-CM | POA: Diagnosis not present

## 2022-05-07 DIAGNOSIS — Z08 Encounter for follow-up examination after completed treatment for malignant neoplasm: Secondary | ICD-10-CM | POA: Diagnosis not present

## 2022-05-07 DIAGNOSIS — X32XXXD Exposure to sunlight, subsequent encounter: Secondary | ICD-10-CM | POA: Diagnosis not present

## 2022-05-07 DIAGNOSIS — B078 Other viral warts: Secondary | ICD-10-CM | POA: Diagnosis not present

## 2022-05-07 DIAGNOSIS — D225 Melanocytic nevi of trunk: Secondary | ICD-10-CM | POA: Diagnosis not present

## 2022-06-17 DIAGNOSIS — E1140 Type 2 diabetes mellitus with diabetic neuropathy, unspecified: Secondary | ICD-10-CM | POA: Diagnosis not present

## 2022-06-19 DIAGNOSIS — M9902 Segmental and somatic dysfunction of thoracic region: Secondary | ICD-10-CM | POA: Diagnosis not present

## 2022-06-19 DIAGNOSIS — M62830 Muscle spasm of back: Secondary | ICD-10-CM | POA: Diagnosis not present

## 2022-06-19 DIAGNOSIS — M9903 Segmental and somatic dysfunction of lumbar region: Secondary | ICD-10-CM | POA: Diagnosis not present

## 2022-06-19 DIAGNOSIS — M9905 Segmental and somatic dysfunction of pelvic region: Secondary | ICD-10-CM | POA: Diagnosis not present

## 2022-06-23 DIAGNOSIS — J189 Pneumonia, unspecified organism: Secondary | ICD-10-CM | POA: Diagnosis not present

## 2022-06-23 DIAGNOSIS — E1169 Type 2 diabetes mellitus with other specified complication: Secondary | ICD-10-CM | POA: Diagnosis not present

## 2022-06-23 DIAGNOSIS — I10 Essential (primary) hypertension: Secondary | ICD-10-CM | POA: Diagnosis not present

## 2022-06-23 DIAGNOSIS — R739 Hyperglycemia, unspecified: Secondary | ICD-10-CM | POA: Diagnosis not present

## 2022-06-24 DIAGNOSIS — R600 Localized edema: Secondary | ICD-10-CM | POA: Diagnosis not present

## 2022-06-24 DIAGNOSIS — G629 Polyneuropathy, unspecified: Secondary | ICD-10-CM | POA: Diagnosis not present

## 2022-06-26 DIAGNOSIS — H524 Presbyopia: Secondary | ICD-10-CM | POA: Diagnosis not present

## 2022-06-27 DIAGNOSIS — M9903 Segmental and somatic dysfunction of lumbar region: Secondary | ICD-10-CM | POA: Diagnosis not present

## 2022-06-27 DIAGNOSIS — M9902 Segmental and somatic dysfunction of thoracic region: Secondary | ICD-10-CM | POA: Diagnosis not present

## 2022-06-27 DIAGNOSIS — M9905 Segmental and somatic dysfunction of pelvic region: Secondary | ICD-10-CM | POA: Diagnosis not present

## 2022-06-27 DIAGNOSIS — M62830 Muscle spasm of back: Secondary | ICD-10-CM | POA: Diagnosis not present

## 2022-08-26 IMAGING — US US EXTREM LOW VENOUS*L*
1 series · 14 of 24 positions shown · non-contrast
Comparison: None.

CLINICAL DATA: Left lower extremity pain.

EXAM:
LEFT LOWER EXTREMITY VENOUS DOPPLER ULTRASOUND
TECHNIQUE: Gray-scale sonography with compression, as well as color and duplex
ultrasound, were performed to evaluate the deep venous system(s)
from the level of the common femoral vein through the popliteal and
proximal calf veins.

[Series 1: us venous img lower uni left (dvt) · portal-venous · 14 of 40 slices shown]
[im 1/40]
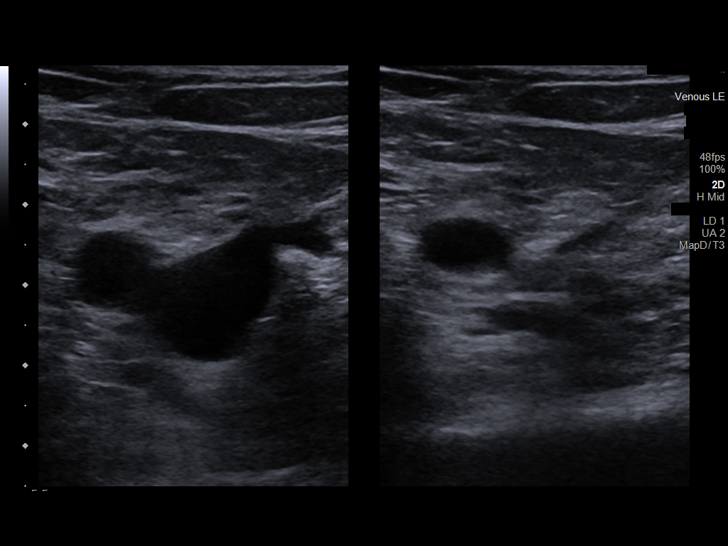
[im 4/40]
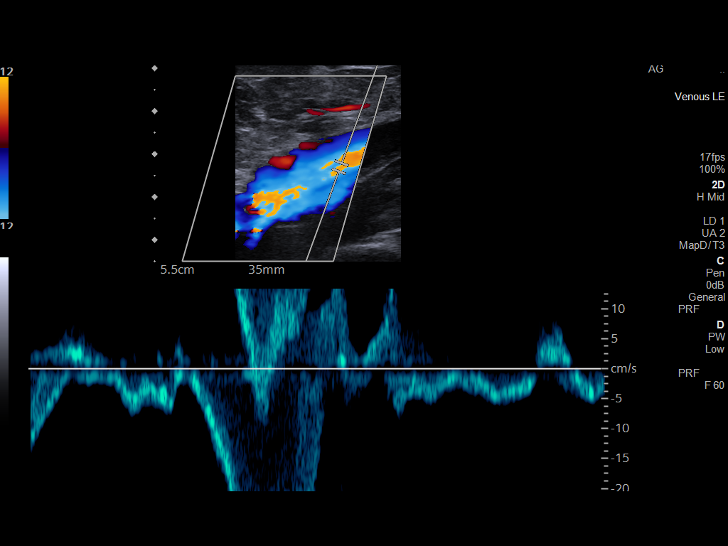
[im 7/40]
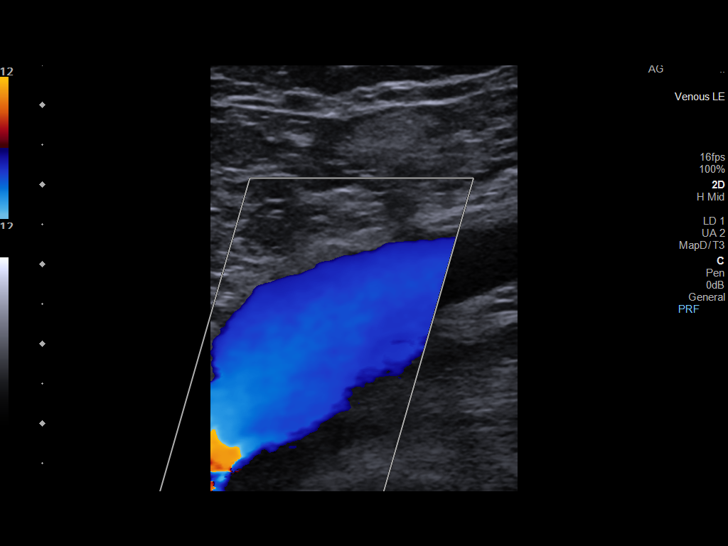
[im 11/40]
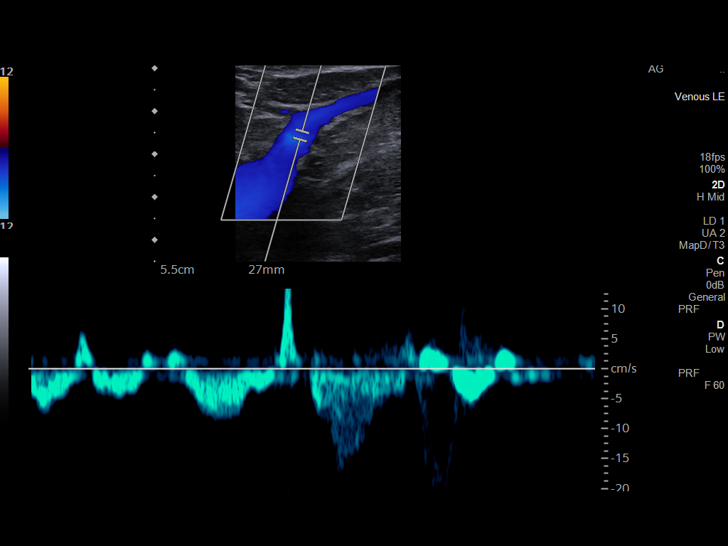
[im 12/40]
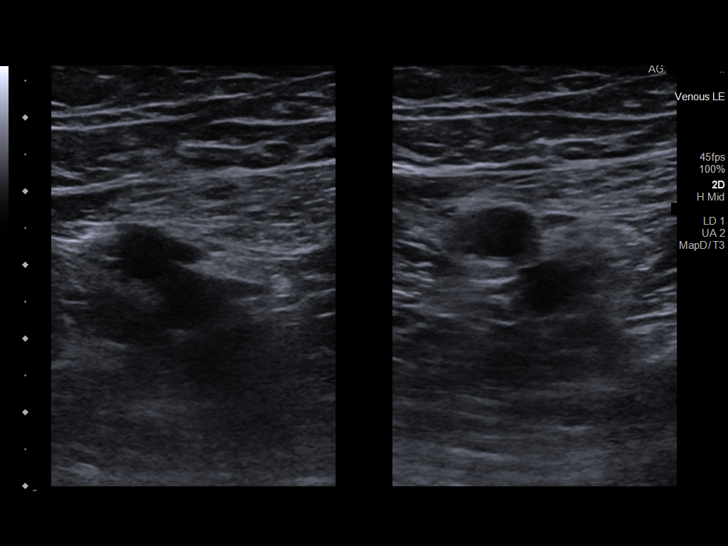
[im 16/40]
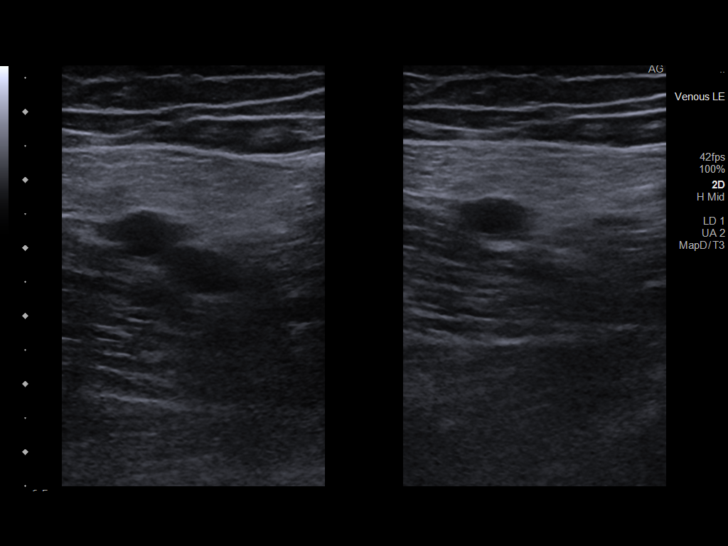
[im 19/40]
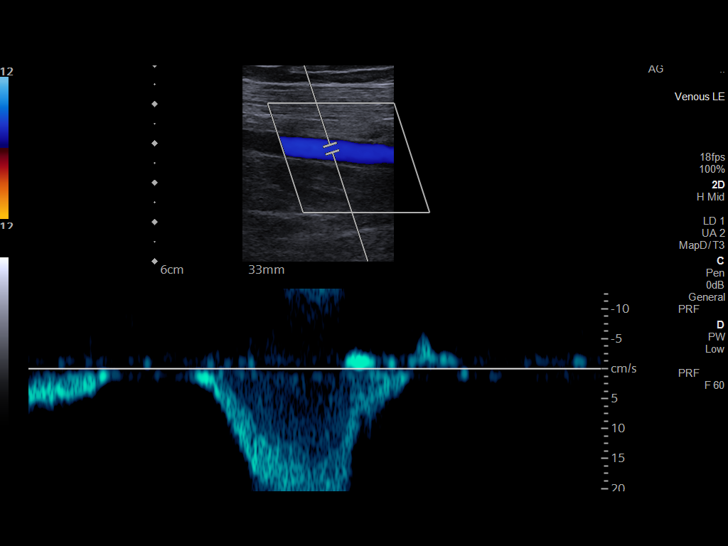
[im 21/40]
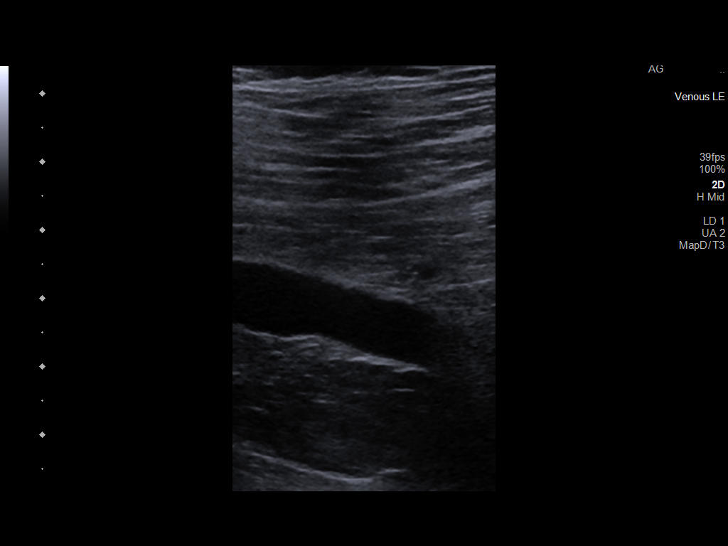
[im 24/40]
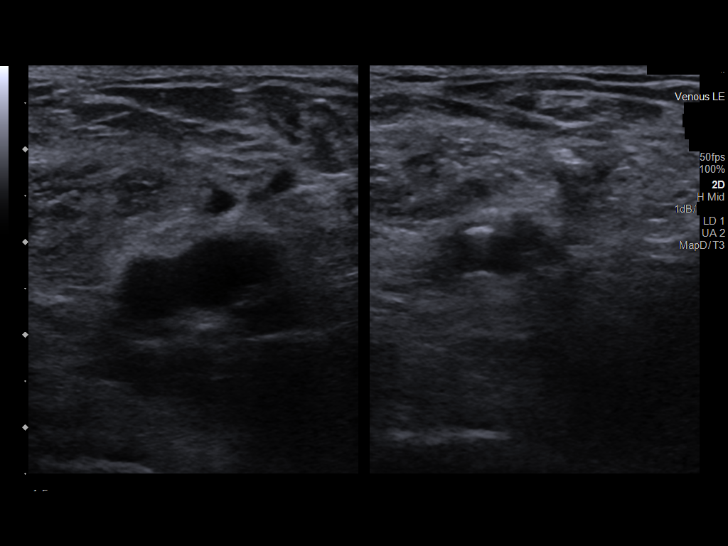
[im 28/40]
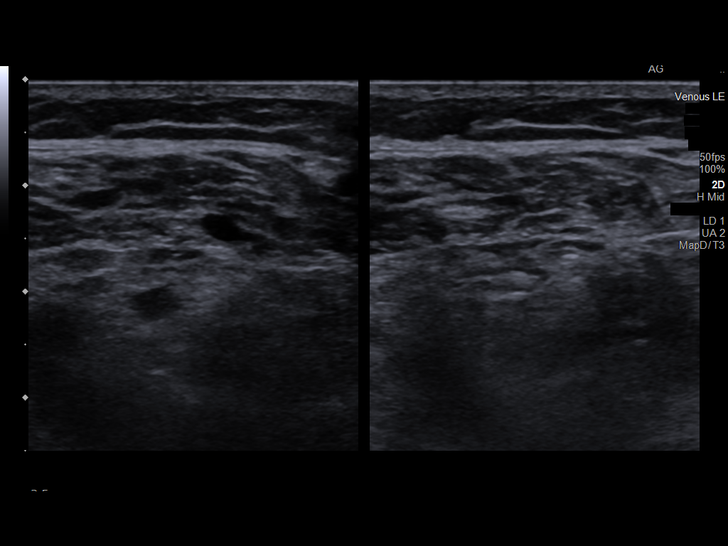
[im 31/40]
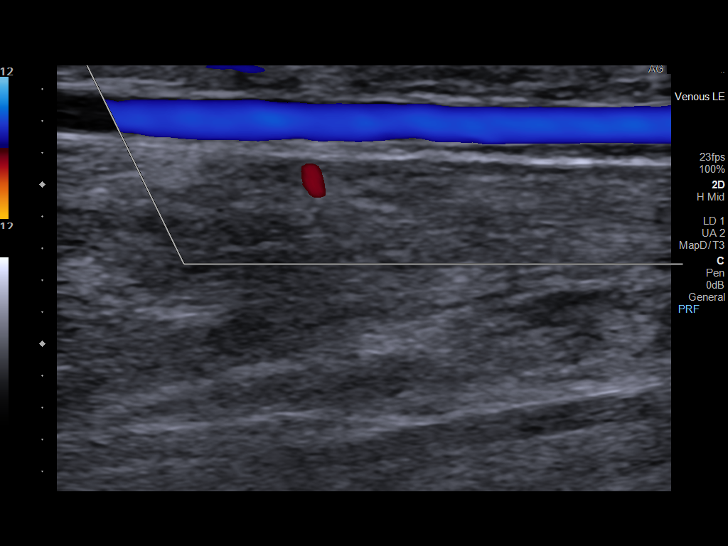
[im 33/40]
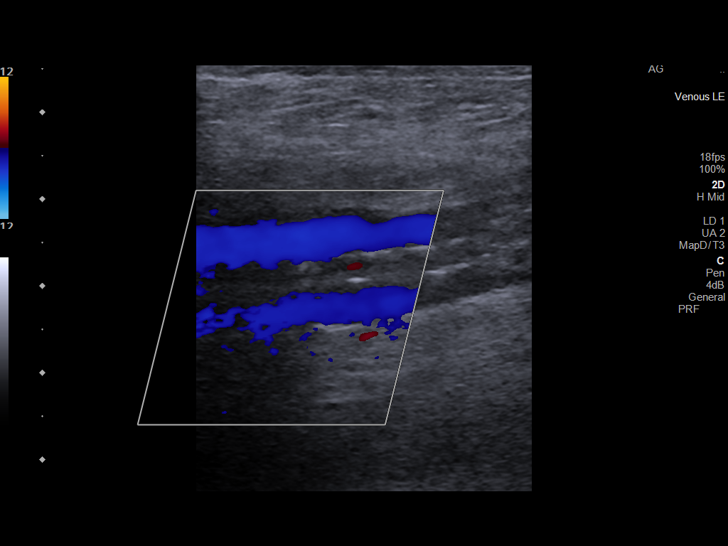
[im 36/40]
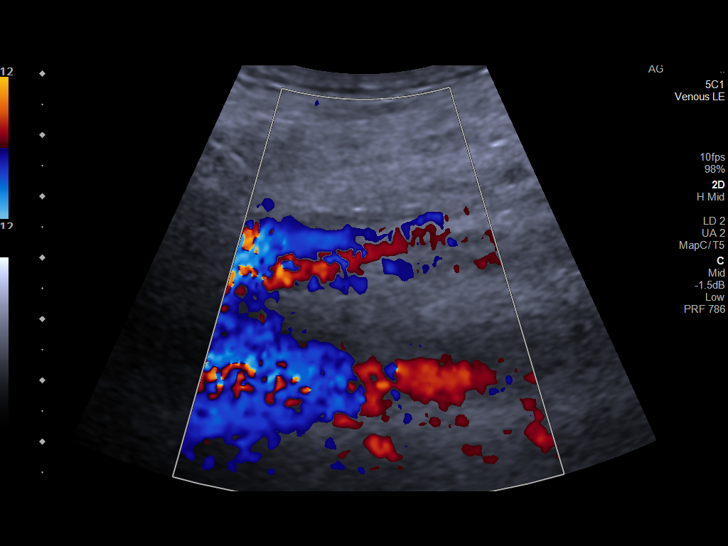
[im 40/40]
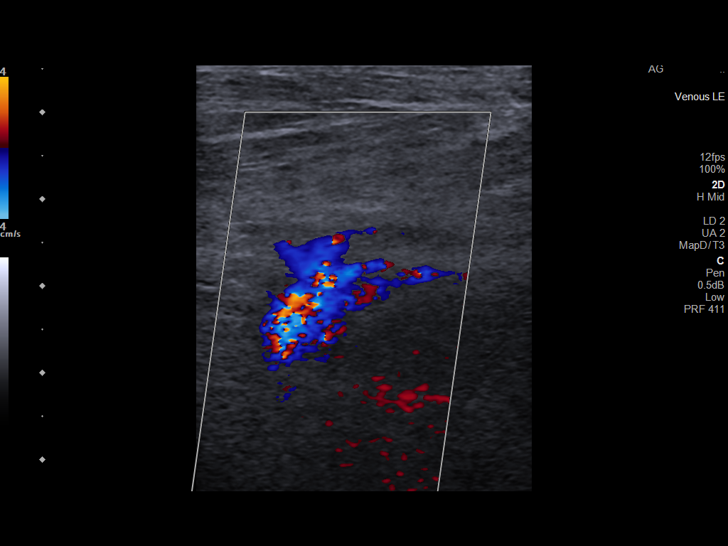

[14 of 24 positions shown; findings below may reference images not displayed]

FINDINGS: VENOUS

Normal compressibility of the common femoral, superficial femoral,
and popliteal veins, as well as the visualized calf veins.
Visualized portions of profunda femoral vein and great saphenous
vein unremarkable. No filling defects to suggest DVT on grayscale or
color Doppler imaging. Doppler waveforms show normal direction of
venous flow, normal respiratory plasticity and response to
augmentation.

Limited views of the contralateral common femoral vein are
unremarkable.
IMPRESSION: No evidence of DVT left lower extremity.

## 2022-08-29 IMAGING — DX DG HAND COMPLETE 3+V*L*
3 series · 3 of 3 positions shown · non-contrast
Comparison: None

CLINICAL DATA: Closed index finger in car door, pain

EXAM:
LEFT HAND - COMPLETE 3+ VIEW

[hand pa]
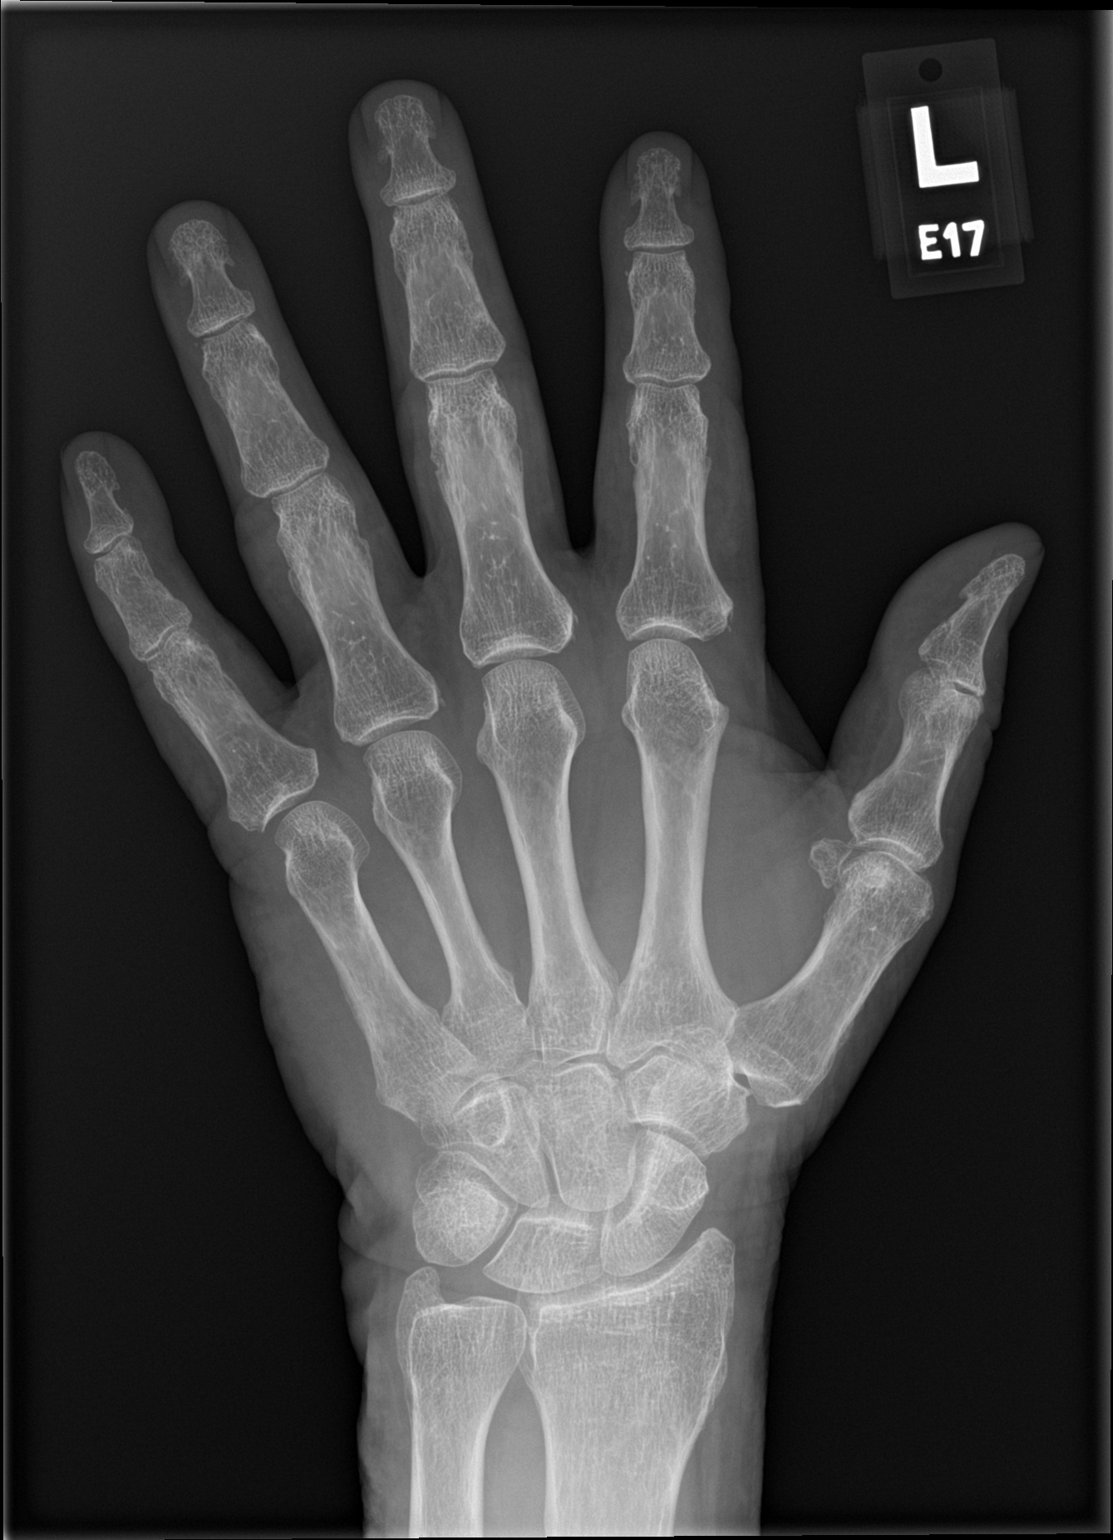

[hand obl]
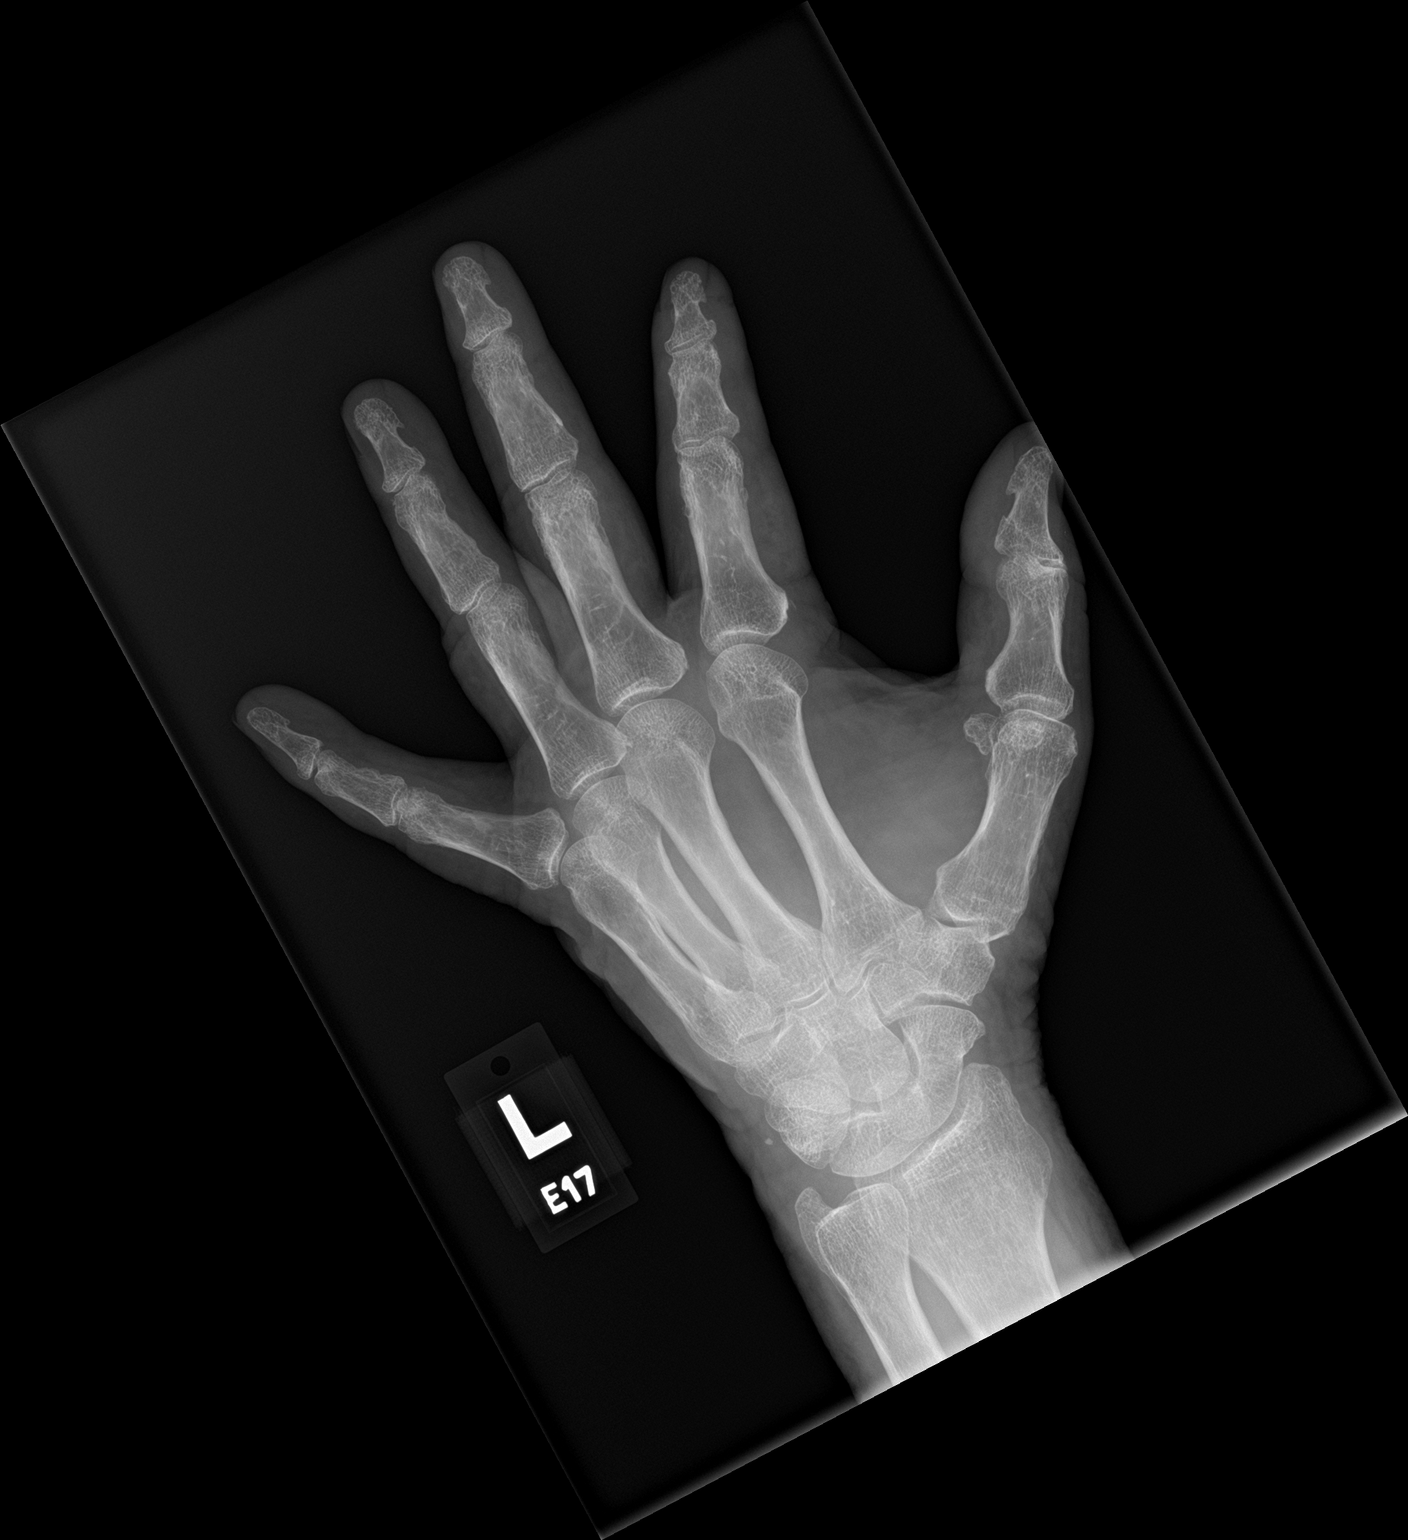

[hand lat]
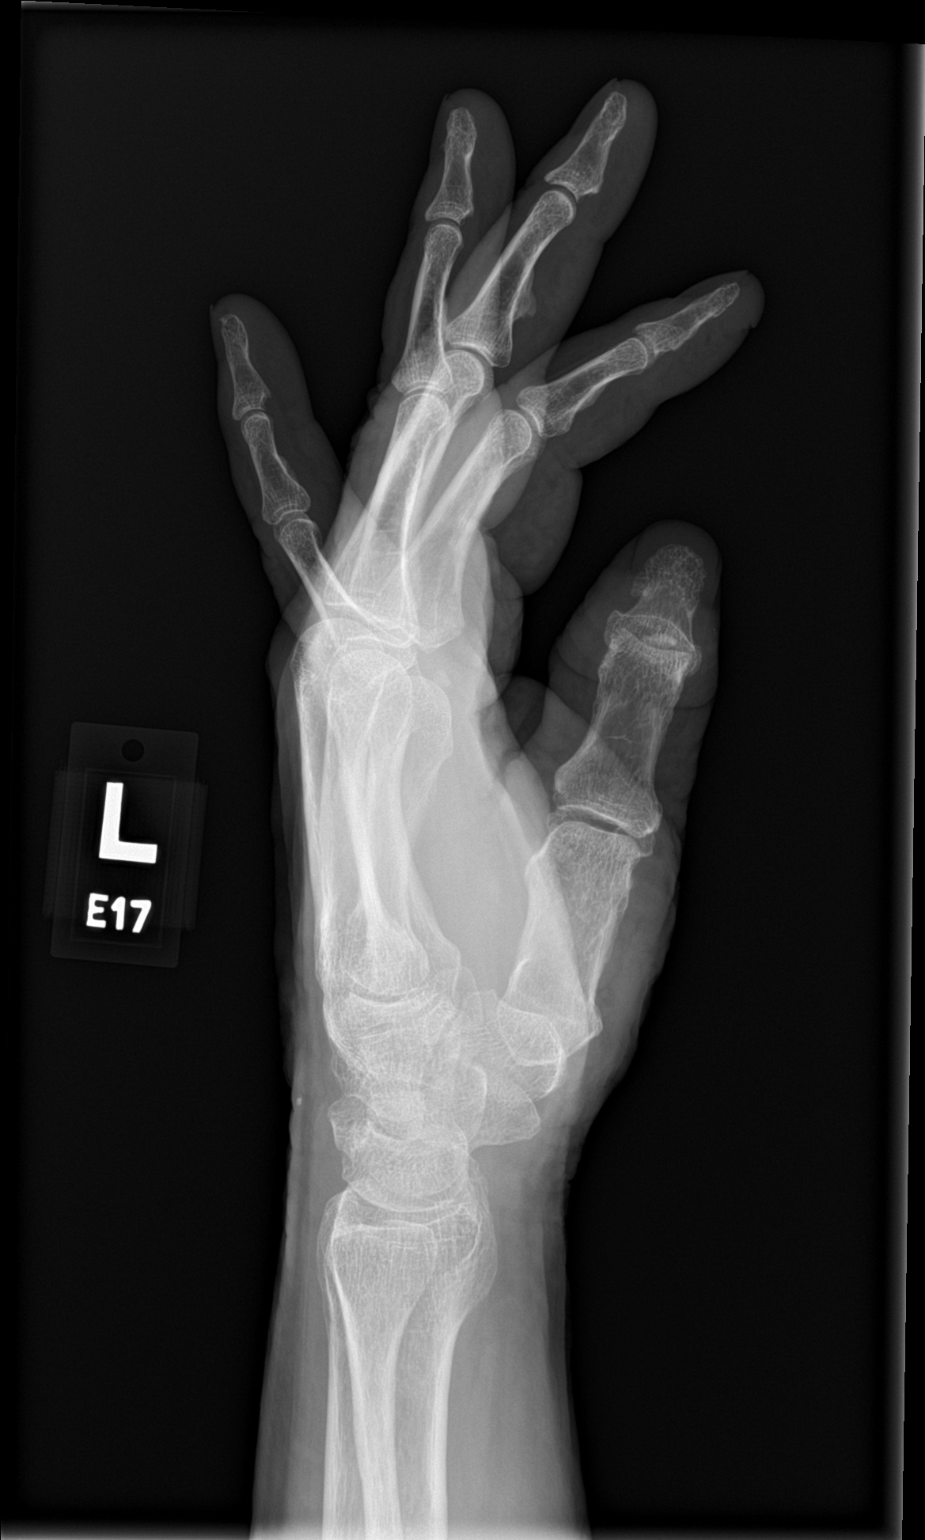

[3 of 3 positions shown; findings below may reference images not displayed]

FINDINGS: Osseous demineralization.

Joint spaces preserved.

No acute fracture, dislocation, or bone destruction.

Tiny radiopaque foreign body projects over the dorsum of the carpus
on the lateral view.
IMPRESSION: No acute osseous abnormalities.

## 2022-09-16 DIAGNOSIS — E1140 Type 2 diabetes mellitus with diabetic neuropathy, unspecified: Secondary | ICD-10-CM | POA: Diagnosis not present

## 2022-09-23 DIAGNOSIS — E1140 Type 2 diabetes mellitus with diabetic neuropathy, unspecified: Secondary | ICD-10-CM | POA: Diagnosis not present

## 2022-09-23 DIAGNOSIS — C44619 Basal cell carcinoma of skin of left upper limb, including shoulder: Secondary | ICD-10-CM | POA: Diagnosis not present

## 2022-09-23 DIAGNOSIS — I10 Essential (primary) hypertension: Secondary | ICD-10-CM | POA: Diagnosis not present

## 2022-09-30 DIAGNOSIS — D0422 Carcinoma in situ of skin of left ear and external auricular canal: Secondary | ICD-10-CM | POA: Diagnosis not present

## 2022-09-30 DIAGNOSIS — C44629 Squamous cell carcinoma of skin of left upper limb, including shoulder: Secondary | ICD-10-CM | POA: Diagnosis not present

## 2022-09-30 DIAGNOSIS — D0462 Carcinoma in situ of skin of left upper limb, including shoulder: Secondary | ICD-10-CM | POA: Diagnosis not present

## 2022-10-14 DIAGNOSIS — H9193 Unspecified hearing loss, bilateral: Secondary | ICD-10-CM | POA: Diagnosis not present

## 2022-10-14 DIAGNOSIS — N1831 Chronic kidney disease, stage 3a: Secondary | ICD-10-CM | POA: Diagnosis not present

## 2022-10-14 DIAGNOSIS — E785 Hyperlipidemia, unspecified: Secondary | ICD-10-CM | POA: Diagnosis not present

## 2022-10-14 DIAGNOSIS — Z85828 Personal history of other malignant neoplasm of skin: Secondary | ICD-10-CM | POA: Diagnosis not present

## 2022-10-14 DIAGNOSIS — N529 Male erectile dysfunction, unspecified: Secondary | ICD-10-CM | POA: Diagnosis not present

## 2022-10-14 DIAGNOSIS — I129 Hypertensive chronic kidney disease with stage 1 through stage 4 chronic kidney disease, or unspecified chronic kidney disease: Secondary | ICD-10-CM | POA: Diagnosis not present

## 2022-10-14 DIAGNOSIS — G629 Polyneuropathy, unspecified: Secondary | ICD-10-CM | POA: Diagnosis not present

## 2022-10-14 DIAGNOSIS — Z008 Encounter for other general examination: Secondary | ICD-10-CM | POA: Diagnosis not present

## 2022-11-05 DIAGNOSIS — L820 Inflamed seborrheic keratosis: Secondary | ICD-10-CM | POA: Diagnosis not present

## 2022-11-05 DIAGNOSIS — Z85828 Personal history of other malignant neoplasm of skin: Secondary | ICD-10-CM | POA: Diagnosis not present

## 2022-11-05 DIAGNOSIS — Z08 Encounter for follow-up examination after completed treatment for malignant neoplasm: Secondary | ICD-10-CM | POA: Diagnosis not present

## 2022-12-23 DIAGNOSIS — R600 Localized edema: Secondary | ICD-10-CM | POA: Diagnosis not present

## 2022-12-23 DIAGNOSIS — G629 Polyneuropathy, unspecified: Secondary | ICD-10-CM | POA: Diagnosis not present

## 2022-12-30 DIAGNOSIS — E1140 Type 2 diabetes mellitus with diabetic neuropathy, unspecified: Secondary | ICD-10-CM | POA: Diagnosis not present

## 2022-12-30 DIAGNOSIS — G9009 Other idiopathic peripheral autonomic neuropathy: Secondary | ICD-10-CM | POA: Diagnosis not present

## 2022-12-30 DIAGNOSIS — I10 Essential (primary) hypertension: Secondary | ICD-10-CM | POA: Diagnosis not present

## 2023-03-10 DIAGNOSIS — L821 Other seborrheic keratosis: Secondary | ICD-10-CM | POA: Diagnosis not present

## 2023-03-10 DIAGNOSIS — L570 Actinic keratosis: Secondary | ICD-10-CM | POA: Diagnosis not present

## 2023-03-10 DIAGNOSIS — Z85828 Personal history of other malignant neoplasm of skin: Secondary | ICD-10-CM | POA: Diagnosis not present

## 2023-03-10 DIAGNOSIS — L820 Inflamed seborrheic keratosis: Secondary | ICD-10-CM | POA: Diagnosis not present

## 2023-03-10 DIAGNOSIS — Z08 Encounter for follow-up examination after completed treatment for malignant neoplasm: Secondary | ICD-10-CM | POA: Diagnosis not present

## 2023-03-10 DIAGNOSIS — X32XXXD Exposure to sunlight, subsequent encounter: Secondary | ICD-10-CM | POA: Diagnosis not present

## 2023-03-11 DIAGNOSIS — S00251A Superficial foreign body of right eyelid and periocular area, initial encounter: Secondary | ICD-10-CM | POA: Diagnosis not present

## 2023-04-10 DIAGNOSIS — E1140 Type 2 diabetes mellitus with diabetic neuropathy, unspecified: Secondary | ICD-10-CM | POA: Diagnosis not present

## 2023-08-10 ENCOUNTER — Other Ambulatory Visit: Payer: Self-pay

## 2023-08-10 ENCOUNTER — Emergency Department (HOSPITAL_COMMUNITY)
Admission: EM | Admit: 2023-08-10 | Discharge: 2023-08-11 | Disposition: A | Discharge status: Home / Self Care | Attending: Emergency Medicine | Admitting: Emergency Medicine

## 2023-08-10 ENCOUNTER — Encounter (HOSPITAL_COMMUNITY): Payer: Self-pay | Admitting: Emergency Medicine

## 2023-08-10 DIAGNOSIS — Z7982 Long term (current) use of aspirin: Secondary | ICD-10-CM | POA: Insufficient documentation

## 2023-08-10 DIAGNOSIS — R079 Chest pain, unspecified: Secondary | ICD-10-CM | POA: Insufficient documentation

## 2023-08-10 DIAGNOSIS — R1084 Generalized abdominal pain: Secondary | ICD-10-CM | POA: Diagnosis not present

## 2023-08-10 DIAGNOSIS — R0789 Other chest pain: Secondary | ICD-10-CM | POA: Diagnosis not present

## 2023-08-10 MED ORDER — NITROGLYCERIN 0.4 MG SL SUBL
0.4000 mg | SUBLINGUAL_TABLET | Freq: Once | SUBLINGUAL | Status: AC
  Administered 2023-08-11: 0.4 mg via SUBLINGUAL
  Filled 2023-08-10: qty 1

## 2023-08-10 MED ORDER — ASPIRIN 81 MG PO CHEW
324.0000 mg | CHEWABLE_TABLET | Freq: Once | ORAL | Status: AC
  Administered 2023-08-11: 324 mg via ORAL
  Filled 2023-08-10: qty 4

## 2023-08-10 NOTE — ED Triage Notes (Signed)
 Pt c/o chest/upper epigastric pain x 4 hours. Pt states he took laxatives before coming to ER.

## 2023-08-11 ENCOUNTER — Encounter (HOSPITAL_COMMUNITY): Payer: Self-pay

## 2023-08-11 ENCOUNTER — Emergency Department (HOSPITAL_COMMUNITY)

## 2023-08-11 ENCOUNTER — Other Ambulatory Visit (HOSPITAL_COMMUNITY)

## 2023-08-11 DIAGNOSIS — R1013 Epigastric pain: Secondary | ICD-10-CM | POA: Diagnosis not present

## 2023-08-11 DIAGNOSIS — J9811 Atelectasis: Secondary | ICD-10-CM | POA: Diagnosis not present

## 2023-08-11 DIAGNOSIS — R509 Fever, unspecified: Secondary | ICD-10-CM | POA: Diagnosis not present

## 2023-08-11 DIAGNOSIS — K7689 Other specified diseases of liver: Secondary | ICD-10-CM | POA: Diagnosis not present

## 2023-08-11 DIAGNOSIS — N281 Cyst of kidney, acquired: Secondary | ICD-10-CM | POA: Diagnosis not present

## 2023-08-11 DIAGNOSIS — R079 Chest pain, unspecified: Secondary | ICD-10-CM | POA: Diagnosis not present

## 2023-08-11 DIAGNOSIS — K8020 Calculus of gallbladder without cholecystitis without obstruction: Secondary | ICD-10-CM | POA: Diagnosis not present

## 2023-08-11 LAB — CBC WITH DIFFERENTIAL/PLATELET
Abs Immature Granulocytes: 0.04 K/uL (ref 0.00–0.07)
Basophils Absolute: 0.1 K/uL (ref 0.0–0.1)
Basophils Relative: 0 %
Eosinophils Absolute: 0.4 K/uL (ref 0.0–0.5)
Eosinophils Relative: 3 %
HCT: 46.6 % (ref 39.0–52.0)
Hemoglobin: 15.7 g/dL (ref 13.0–17.0)
Immature Granulocytes: 0 %
Lymphocytes Relative: 19 %
Lymphs Abs: 2.3 K/uL (ref 0.7–4.0)
MCH: 30.4 pg (ref 26.0–34.0)
MCHC: 33.7 g/dL (ref 30.0–36.0)
MCV: 90.3 fL (ref 80.0–100.0)
Monocytes Absolute: 1 K/uL (ref 0.1–1.0)
Monocytes Relative: 8 %
Neutro Abs: 8.8 K/uL — ABNORMAL HIGH (ref 1.7–7.7)
Neutrophils Relative %: 70 %
Platelets: 186 K/uL (ref 150–400)
RBC: 5.16 MIL/uL (ref 4.22–5.81)
RDW: 12.2 % (ref 11.5–15.5)
WBC: 12.6 K/uL — ABNORMAL HIGH (ref 4.0–10.5)
nRBC: 0 % (ref 0.0–0.2)

## 2023-08-11 LAB — COMPREHENSIVE METABOLIC PANEL WITH GFR
ALT: 16 U/L (ref 0–44)
AST: 19 U/L (ref 15–41)
Albumin: 4.3 g/dL (ref 3.5–5.0)
Alkaline Phosphatase: 80 U/L (ref 38–126)
Anion gap: 14 (ref 5–15)
BUN: 21 mg/dL (ref 8–23)
CO2: 28 mmol/L (ref 22–32)
Calcium: 9.7 mg/dL (ref 8.9–10.3)
Chloride: 98 mmol/L (ref 98–111)
Creatinine, Ser: 1.37 mg/dL — ABNORMAL HIGH (ref 0.61–1.24)
GFR, Estimated: 49 mL/min — ABNORMAL LOW (ref >60)
Glucose, Bld: 161 mg/dL — ABNORMAL HIGH (ref 70–99)
Potassium: 3.5 mmol/L (ref 3.5–5.1)
Sodium: 140 mmol/L (ref 135–145)
Total Bilirubin: 0.7 mg/dL (ref 0.0–1.2)
Total Protein: 7.6 g/dL (ref 6.5–8.1)

## 2023-08-11 LAB — TROPONIN I (HIGH SENSITIVITY)
Troponin I (High Sensitivity): 9 ng/L (ref <18)
Troponin I (High Sensitivity): 8 ng/L (ref <18)

## 2023-08-11 LAB — LIPASE, BLOOD: Lipase: 28 U/L (ref 11–51)

## 2023-08-11 MED ORDER — IOHEXOL 300 MG/ML  SOLN
100.0000 mL | Freq: Once | INTRAMUSCULAR | Status: AC | PRN
  Administered 2023-08-11: 100 mL via INTRAVENOUS

## 2023-08-11 NOTE — ED Notes (Signed)
 ED Provider at bedside.

## 2023-08-11 NOTE — ED Notes (Signed)
 Pt c/o stomach ache- pt endorses taking laxatives prior to coming to ED-Dr Haze made aware.

## 2023-08-11 NOTE — ED Notes (Signed)
Pt given water per request and Dr Betsey Holiday ok.

## 2023-08-11 NOTE — ED Notes (Signed)
 Patient transported to CT

## 2023-08-11 NOTE — ED Provider Notes (Signed)
 [place] EMERGENCY DEPARTMENT AT [facility] Provider Note   CSN: [id number] Arrival date & time: 08/10/23  2342     Patient presents with: No chief complaint on file.   Daniel Mathis is a 89 y.o. male.   Patient presents to the emergency department for evaluation of chest pain.  Patient reports that his pain has been present for about 4 hours.  Pain started while he was at rest.  He indicates that the pain is across the lower ribs bilaterally.  No cough, shortness of breath.  Patient reports that he has been having abdominal cramping.  He thought he was constipated, took several laxatives without a bowel movement.       Prior to Admission medications   Medication Sig Start Date End Date Taking? Authorizing Provider  amLODipine (NORVASC) 2.5 MG tablet Take 1 tablet by mouth daily. 07/17/17   [provider]  aspirin  EC 81 MG tablet Take 1 tablet (81 mg total) by mouth daily. Swallow whole. 12/02/21   Shahmehdi, Seyed A, MD  hydrochlorothiazide (HYDRODIURIL) 25 MG tablet Take 25 mg by mouth daily. 11/25/21   [provider]  losartan (COZAAR) 100 MG tablet Take 100 mg by mouth daily. 09/30/21   [provider]  pregabalin  (LYRICA ) 100 MG capsule Take 100 mg by mouth 3 (three) times daily. 11/12/21   [provider]    Allergies: Patient has no known allergies.    Review of Systems  Updated Vital Signs BP (!) 175/68   Pulse 69   Temp 98.3 F (36.8 C) (Oral)   Resp (!) 22   Ht 6' (1.829 m)   Wt 90 kg   SpO2 97%   BMI 26.91 kg/m   Physical Exam Vitals and nursing note reviewed.  Constitutional:      General: He is not in acute distress.    Appearance: He is well-developed.  HENT:     Head: Normocephalic and atraumatic.     Mouth/Throat:     Mouth: Mucous membranes are moist.  Eyes:     General: Vision grossly intact. Gaze aligned appropriately.     Extraocular Movements: Extraocular movements intact.      Conjunctiva/sclera: Conjunctivae normal.  Cardiovascular:     Rate and Rhythm: Normal rate and regular rhythm.     Pulses: Normal pulses.     Heart sounds: Normal heart sounds, S1 normal and S2 normal. No murmur heard.    No friction rub. No gallop.  Pulmonary:     Effort: Pulmonary effort is normal. No respiratory distress.     Breath sounds: Normal breath sounds.  Abdominal:     Palpations: Abdomen is soft.     Tenderness: There is generalized abdominal tenderness. There is no guarding or rebound.     Hernia: No hernia is present.  Genitourinary:    Rectum: No mass or tenderness.     Comments: No impaction Musculoskeletal:        General: No swelling.     Cervical back: Full passive range of motion without pain, normal range of motion and neck supple. No pain with movement, spinous process tenderness or muscular tenderness. Normal range of motion.     Right lower leg: No edema.     Left lower leg: No edema.  Skin:    General: Skin is warm and dry.     Capillary Refill: Capillary refill takes less than 2 seconds.     Findings: No ecchymosis, erythema, lesion or wound.  Neurological:     Mental Status: He is alert and oriented to person, place, and time.     GCS: GCS eye subscore is 4. GCS verbal subscore is 5. GCS motor subscore is 6.     Cranial Nerves: Cranial nerves 2-12 are intact.     Sensory: Sensation is intact.     Motor: Motor function is intact. No weakness or abnormal muscle tone.     Coordination: Coordination is intact.  Psychiatric:        Mood and Affect: Mood normal.        Speech: Speech normal.        Behavior: Behavior normal.     (all labs ordered are listed, but only abnormal results are displayed) Labs Reviewed  CBC WITH DIFFERENTIAL/PLATELET - Abnormal; Notable for the following components:      Result Value   WBC 12.6 (*)    Neutro Abs 8.8 (*)    All other components within normal limits  COMPREHENSIVE METABOLIC PANEL WITH GFR - Abnormal; Notable  for the following components:   Glucose, Bld 161 (*)    Creatinine, Ser 1.37 (*)    GFR, Estimated 49 (*)    All other components within normal limits  LIPASE, BLOOD  TROPONIN I (HIGH SENSITIVITY)  TROPONIN I (HIGH SENSITIVITY)    EKG: EKG Interpretation Date/Time:  Monday August 10 2023 23:52:10 EDT Ventricular Rate:  62 PR Interval:  208 QRS Duration:  136 QT Interval:  444 QTC Calculation: 451 R Axis:   -58  Text 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 Multiplanar reformatted images are provided for review. Automated exposure control, iterative reconstruction, and/or weight based adjustment of the mA/kV was utilized to reduce the radiation dose to as low as reasonably achievable. COMPARISON: None available. CLINICAL HISTORY: Abdominal pain, acute, nonlocalized. Pt c/o chest/upper epigastric pain x 4 hours. Pt states he took laxatives before coming to ER. FINDINGS: LOWER CHEST: Mild scarring/atelectasis at the lung bases. LIVER: Scattered small hepatic cysts measuring up to 14 mm (image 26), benign. GALLBLADDER AND BILE DUCTS: Layering small gallstones (image 33), without associated inflammatory changes. SPLEEN: No acute abnormality. PANCREAS: No acute abnormality. ADRENAL GLANDS: No acute abnormality. KIDNEYS, URETERS AND BLADDER: Bilateral renal sinus cysts with a dominant 6.2 cm simple cyst in the anterior left lower kidney (image 45), benign (Bosniak 1). No follow-up is recommended. No stones in the kidneys or ureters. No hydronephrosis. No perinephric or periureteral stranding. Urinary bladder is  unremarkable. GI AND BOWEL: Appendix is not discretely visualized. Stomach demonstrates no acute abnormality. There is no bowel obstruction. No bowel wall thickening. PERITONEUM AND RETROPERITONEUM: No ascites. No free air. VASCULATURE: Atherosclerotic calcifications of the abdominal aorta and branch vessels. LYMPH NODES: No lymphadenopathy. REPRODUCTIVE ORGANS: Prostatomegaly, with enlargement of the central gland indenting the base of the bladder, suggesting BPH. BONES AND SOFT TISSUES: Status post ORIF of the right hip. Degenerative changes of the visualized thoracolumbar spine. No acute osseous abnormality. No focal soft tissue abnormality. IMPRESSION: 1. No acute findings in the abdomen or pelvis. 2. Cholelithiasis, without associated inflammatory changes. 3. Prostatomegaly, suggesting BPH. Electronically signed by: Pinkie Pebbles MD 08/11/2023 02:32  AM EDT RP Workstation: [id number]   DG Chest Port 1 View Result Date: 08/11/2023 EXAM: 1 VIEW XRAY OF THE CHEST 08/11/2023 12:32:00 AM COMPARISON: 03/19/2006 CLINICAL HISTORY: Fever [id number]. Approx. 8 pm: AP mid region Chest pain FINDINGS: LUNGS AND PLEURA: Mild left basilar atelectasis. No pulmonary edema. No pleural effusion. No pneumothorax. HEART AND MEDIASTINUM: No acute abnormality of the cardiac and mediastinal silhouettes. BONES AND SOFT TISSUES: No acute osseous abnormality. IMPRESSION: 1. No acute process. 2. Mild left basilar atelectasis. Electronically signed by: Pinkie Pebbles MD 08/11/2023 12:39 AM EDT RP Workstation: [id number]     Procedures   Medications Ordered in the ED  aspirin  chewable tablet 324 mg (324 mg Oral Given 08/11/23 0022)  nitroGLYCERIN  (NITROSTAT ) SL tablet 0.4 mg (0.4 mg Sublingual Given 08/11/23 0023)  iohexol  (OMNIPAQUE ) 300 MG/ML solution 100 mL (100 mLs Intravenous Contrast Given 08/11/23 0207)                                    Medical Decision Making Amount and/or Complexity of Data Reviewed External Data  Reviewed: ECG. Labs: ordered. Decision-making details documented in ED Course. Radiology: ordered and independent interpretation performed. Decision-making details documented in ED Course. ECG/medicine tests: ordered and independent interpretation performed. Decision-making details documented in ED Course.  Risk OTC drugs. Prescription drug management.   Differential Diagnosis considered includes, but not limited to: STEMI; NSTEMI; myocarditis; pericarditis; pulmonary embolism; aortic dissection; pneumothorax; pneumonia; gastritis; musculoskeletal pain   Presents to the emergency department for evaluation of chest pain.  Patient also indicates that he has been experiencing abdominal cramping, thought he might be constipated.  Patient hypertensive at arrival.  He does have a history of hypertension.  Improved after nitroglycerin .  Patient's EKG unchanged from prior.  Troponin negative x 2.  Abdominal exam was nonspecific.  He had some mild diffuse tenderness, no signs of peritonitis.  LFTs, lipase normal.  Patient underwent CT scan of abdomen and pelvis, no acute pathology noted.  No obstruction.  No significant constipation noted.  Patient's workup has been reassuring.  Patient will be appropriate for discharge and follow-up with primary care.     Final diagnoses:  Chest pain, unspecified type  Generalized abdominal pain    ED Discharge Orders     None          Haze Lonni PARAS, MD 08/11/23 [phone]

## 2023-08-12 DIAGNOSIS — Z79899 Other long term (current) drug therapy: Secondary | ICD-10-CM | POA: Diagnosis not present

## 2023-08-12 DIAGNOSIS — E1140 Type 2 diabetes mellitus with diabetic neuropathy, unspecified: Secondary | ICD-10-CM | POA: Diagnosis not present

## 2023-08-18 DIAGNOSIS — C44622 Squamous cell carcinoma of skin of right upper limb, including shoulder: Secondary | ICD-10-CM | POA: Diagnosis not present

## 2023-12-12 ENCOUNTER — Emergency Department (HOSPITAL_COMMUNITY)
Admission: EM | Admit: 2023-12-12 | Discharge: 2023-12-12 | Disposition: A | Discharge status: Home / Self Care | Attending: Emergency Medicine | Admitting: Emergency Medicine

## 2023-12-12 ENCOUNTER — Other Ambulatory Visit: Payer: Self-pay

## 2023-12-12 ENCOUNTER — Encounter (HOSPITAL_COMMUNITY): Payer: Self-pay | Admitting: Emergency Medicine

## 2023-12-12 DIAGNOSIS — H538 Other visual disturbances: Secondary | ICD-10-CM | POA: Diagnosis not present

## 2023-12-12 DIAGNOSIS — Z79899 Other long term (current) drug therapy: Secondary | ICD-10-CM | POA: Insufficient documentation

## 2023-12-12 DIAGNOSIS — Z7982 Long term (current) use of aspirin: Secondary | ICD-10-CM | POA: Insufficient documentation

## 2023-12-12 DIAGNOSIS — H539 Unspecified visual disturbance: Secondary | ICD-10-CM

## 2023-12-12 DIAGNOSIS — I10 Essential (primary) hypertension: Secondary | ICD-10-CM | POA: Diagnosis not present

## 2023-12-12 MED ORDER — TETRACAINE HCL 0.5 % OP SOLN
2.0000 [drp] | Freq: Once | OPHTHALMIC | Status: AC
  Administered 2023-12-12: 2 [drp] via OPHTHALMIC
  Filled 2023-12-12: qty 4

## 2023-12-12 MED ORDER — FLUORESCEIN SODIUM 1 MG OP STRP
1.0000 | ORAL_STRIP | Freq: Once | OPHTHALMIC | Status: AC
Start: 2023-12-12 — End: 2023-12-12
  Administered 2023-12-12: 1 via OPHTHALMIC
  Filled 2023-12-12: qty 1

## 2023-12-12 NOTE — ED Triage Notes (Signed)
 Pt c/o left eye having pain and redness intermittent x 2 weeks. Pt came in stating his left eye is red. Advised pt it is not red and looks just like the other one. Pt state well, it feels red, like something is wrong with it. Denies blurred vision/dizziness or weakness. Nad. Pupils perrla in triage.

## 2023-12-12 NOTE — ED Provider Notes (Signed)
 [place] EMERGENCY DEPARTMENT AT [facility] Provider Note   CSN: [id number] Arrival date & time: 12/12/23  9046     Patient presents with: Eye Pain   Daniel Mathis is a 89 y.o. male history of hypertension presents with intermittent blurry vision in his left eye.  He states he feels that he has a cover on his eye.  Does describe some discomfort.  No drainage.  Does not feel that any foreign body is in his eye.  No injury or trauma to the eye.  Denies any floaters or curtains. Does not associate with any new headaches, extremity weakness, numbness, difficulty speaking or ambulating.  Does not wear contacts.  Patient was evaluated approximately a year ago for a foreign body sensation in his left eye.  No obvious foreign body was obtained.      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 Authorizing Provider  amLODipine (NORVASC) 2.5 MG tablet Take 1 tablet by mouth daily. 07/17/17   [provider]  aspirin  EC 81 MG tablet Take 1 tablet (81 mg total) by mouth daily. Swallow whole. 12/02/21   Shahmehdi, Seyed A, MD  hydrochlorothiazide (HYDRODIURIL) 25 MG tablet Take 25 mg by mouth daily. 11/25/21   [provider]  losartan (COZAAR) 100 MG tablet Take 100 mg by mouth daily. 09/30/21   [provider]  pregabalin  (LYRICA ) 100 MG capsule Take 100 mg by mouth 3 (three) times daily. 11/12/21   [provider]    Allergies: Patient has no known allergies.    Review of Systems  Eyes:  Positive for pain.    Updated Vital Signs BP 132/61   Pulse 63   Temp 97.8 F (36.6 C) (Oral)   Resp 19   SpO2 99%    Physical Exam Vitals and nursing note reviewed.  Constitutional:      General: He is not in acute distress.    Appearance: He is well-developed.  HENT:     Head: Normocephalic and atraumatic.  Eyes:     General:        Right eye: No discharge.        Left eye: No discharge.     Extraocular Movements: Extraocular movements intact.     Conjunctiva/sclera: Conjunctivae normal.     Pupils: Pupils are equal, round, and reactive to light.  Cardiovascular:     Rate and Rhythm: Normal rate and regular rhythm.     Heart sounds: No murmur heard. Pulmonary:     Effort: Pulmonary effort is normal. No respiratory distress.     Breath sounds: Normal breath sounds.  Abdominal:     Palpations: Abdomen is soft.     Tenderness: There is no abdominal tenderness.  Musculoskeletal:        General: No swelling.     Cervical back: Neck supple.  Skin:    General: Skin is warm and dry.     Capillary Refill: Capillary refill takes less than 2 seconds.  Neurological:     Mental Status: He is alert.  Psychiatric:        Mood and Affect: Mood  normal.     (all labs ordered are listed, but only abnormal results are displayed) Labs Reviewed - No data to display  EKG: None  Radiology: No results found.   Procedures   Medications Ordered in the ED  tetracaine  (PONTOCAINE) 0.5 % ophthalmic solution 2 drop (2 drops Both Eyes Given 12/12/23 1158)  fluorescein  ophthalmic strip 1 strip (1 strip Left Eye Given 12/12/23 1158)    Clinical Course as of 12/12/23 1224  Sat Dec 12, 2023  1134 Patient evaluated for intermittent left sided blurry vision with some discomfort x 6 months.  Not associate with any concerning neurological symptoms.  Upon exam he is hemodynamically stable.  His eye exam is reassuring without any conjunctival injection, EOMI, PERRL. Bilateral Near: 20/50R Near: 20/70L Near: 20/50.  Will perform IOP and fluorescein  stain.  [JT]  1213 IOP and fluorescein  stains reassuring, with no  evidence of increased ocular pressure, foreign body or corneal abrasion.  Patient will be discharged home.  Provide ophthalmology follow-up.  Strict return precautions provided..  Patient discharged home.  Patient is understanding and agreement with plan. [JT]    Clinical Course User Index [JT] Donnajean Lynwood DEL, PA-C                                 Medical Decision Making  This patient presents to the ED with chief complaint(s) of eye complaint .  The complaint involves an extensive differential diagnosis and also carries with it a high risk of complications and morbidity.   Pertinent past medical history as listed in HPI  The differential diagnosis includes  Based off exam and history of lower suspicion for  Additional history obtained: Records reviewed Care Everywhere/External Records  Disposition:   Patient will be discharged home. The patient has been appropriately medically screened and/or stabilized in the ED. I have low suspicion for any other emergent medical condition which would require further screening, evaluation or treatment in the ED or require inpatient management. At time of discharge the patient is hemodynamically stable and in no acute distress. I have discussed work-up results and diagnosis with patient and answered all questions. Patient is agreeable with discharge plan. We discussed strict return precautions for returning to the emergency department and they verbalized understanding.     Social Determinants of Health:   none  This note was dictated with voice recognition software.  Despite best efforts at proofreading, errors may have occurred which can change the documentation meaning.       Final diagnoses:  Vision changes    ED Discharge Orders     None          Donnajean Lynwood DEL DEVONNA 12/12/23 1224    Cleotilde Rogue, MD 12/13/23 [phone]

## 2023-12-12 NOTE — Discharge Instructions (Addendum)
 You were evaluated in the emergency room for blurry vision.  Your exam did not show any significant abnormalities.  Please call the number on the sheet to follow-up with the eye doctor.  If you experience any new or worsening symptoms please return to the emergency room.

## 2023-12-14 ENCOUNTER — Encounter (HOSPITAL_COMMUNITY): Payer: Self-pay

## 2023-12-14 ENCOUNTER — Other Ambulatory Visit: Payer: Self-pay

## 2023-12-14 ENCOUNTER — Emergency Department (HOSPITAL_COMMUNITY)
Admission: EM | Admit: 2023-12-14 | Discharge: 2023-12-14 | Disposition: A | Discharge status: Home / Self Care | Attending: Emergency Medicine | Admitting: Emergency Medicine

## 2023-12-14 DIAGNOSIS — Z7982 Long term (current) use of aspirin: Secondary | ICD-10-CM | POA: Insufficient documentation

## 2023-12-14 DIAGNOSIS — H5713 Ocular pain, bilateral: Secondary | ICD-10-CM | POA: Diagnosis present

## 2023-12-14 DIAGNOSIS — H539 Unspecified visual disturbance: Secondary | ICD-10-CM | POA: Insufficient documentation

## 2023-12-14 DIAGNOSIS — I10 Essential (primary) hypertension: Secondary | ICD-10-CM | POA: Diagnosis not present

## 2023-12-14 DIAGNOSIS — Z79899 Other long term (current) drug therapy: Secondary | ICD-10-CM | POA: Insufficient documentation

## 2023-12-14 MED ORDER — FLUORESCEIN SODIUM 1 MG OP STRP
1.0000 | ORAL_STRIP | Freq: Once | OPHTHALMIC | Status: AC
  Administered 2023-12-14: 1 via OPHTHALMIC
  Filled 2023-12-14: qty 1

## 2023-12-14 MED ORDER — TETRACAINE HCL 0.5 % OP SOLN
1.0000 [drp] | Freq: Once | OPHTHALMIC | Status: AC
  Administered 2023-12-14: 1 [drp] via OPHTHALMIC
  Filled 2023-12-14: qty 4

## 2023-12-14 NOTE — Discharge Instructions (Signed)
 Testing done in the ED was reassuring.  You will need further testing that is only available at the eye doctor office.  Call the telephone number below to set up a follow-up appointment with the eye doctor.

## 2023-12-14 NOTE — ED Triage Notes (Signed)
 Pt arrived via POV c/o recurrent bilateral eye pain, more in the left eye than the right. Pt reports he has been unable to see his Ophthalmologist and was seen here recently for the same. Pupils PERRLA in Triage.

## 2023-12-14 NOTE — ED Provider Notes (Signed)
 [place] EMERGENCY DEPARTMENT AT [facility] Provider Note   CSN: [id number] Arrival date & time: 12/14/23  1228     Patient presents with: Eye Pain   Daniel Mathis is a 89 y.o. male.    Eye Pain Associated symptoms include headaches.  Patient presents for bilateral eye pain.  Medical history includes HTN, neuropathy.  He was seen in the ED 2 days ago for similar ocular symptoms.  At the time, he endorsed intermittent blurry vision of his left eye.  He had mild discomfort.  He denied any recent injuries.  He was advised to follow-up with ophthalmology, however, has not been able to yet.  Today, patient reports a central blurring in his left visual field.  He states that his vision out of his right eye seems normal.  He denies any eye pain today.  He does state that he has a mild left-sided headache.     Prior to Admission medications   Medication Sig Start Date End Date Taking? Authorizing Provider  amLODipine (NORVASC) 2.5 MG tablet Take 1 tablet by mouth daily. 07/17/17   [provider]  aspirin  EC 81 MG tablet Take 1 tablet (81 mg total) by mouth daily. Swallow whole. 12/02/21   Shahmehdi, Seyed A, MD  hydrochlorothiazide (HYDRODIURIL) 25 MG tablet Take 25 mg by mouth daily. 11/25/21   [provider]  losartan (COZAAR) 100 MG tablet Take 100 mg by mouth daily. 09/30/21   [provider]  pregabalin  (LYRICA ) 100 MG capsule Take 100 mg by mouth 3 (three) times daily. 11/12/21   [provider]    Allergies: Patient has no known allergies.    Review of Systems  Eyes:  Positive for visual disturbance.  Neurological:  Positive for headaches.  All other systems reviewed and are negative.   Updated Vital Signs BP (!) 184/82   Pulse 65   Temp 98 F (36.7 C) (Oral)   Resp 18   Ht 6' (1.829 m)   Wt 90 kg   SpO2 96%   BMI 26.91 kg/m   Physical Exam Vitals and nursing note reviewed.  Constitutional:      General: He is not  in acute distress.    Appearance: Normal appearance. He is well-developed. He is not ill-appearing, toxic-appearing or diaphoretic.  HENT:     Head: Normocephalic and atraumatic.     Right Ear: External ear normal.     Left Ear: External ear normal.     Nose: Nose normal.  Eyes:     Extraocular Movements: Extraocular movements intact.     Conjunctiva/sclera: Conjunctivae normal.     Pupils: Pupils are equal, round, and reactive to light.  Cardiovascular:     Rate and Rhythm: Normal rate and regular rhythm.  Pulmonary:     Effort: Pulmonary effort is normal. No respiratory distress.  Abdominal:     General: There is no distension.     Palpations: Abdomen is soft.  Musculoskeletal:        General: No swelling. Normal range of motion.     Cervical back: Normal range of motion and neck supple.  Skin:    General: Skin is warm and dry.  Neurological:     General: No focal deficit present.     Mental Status: He is alert and oriented to person, place, and time.     Cranial Nerves: No cranial nerve deficit.     Sensory: No sensory deficit.     Motor: No  weakness.     Coordination: Coordination normal.  Psychiatric:        Mood and Affect: Mood normal.        Behavior: Behavior normal.     (all labs ordered are listed, but only abnormal results are displayed) Labs Reviewed - No data to display  EKG: None  Radiology: No results found.   Procedures   Medications Ordered in the ED  tetracaine  (PONTOCAINE) 0.5 % ophthalmic solution 1 drop (1 drop Both Eyes Given 12/14/23 1732)  fluorescein  ophthalmic strip 1 strip (1 strip Both Eyes Given 12/14/23 1732)                                    Medical Decision Making Risk Prescription drug management.   Patient presenting with central blurring of his vision from his left eye.  He denies any right eye visual changes.  He has a mild left-sided headache, rated at 4/10 in severity.  On initial exam, patient has pupils that are  equal and reactive.  Cornea and conjunctivae are quiet.  He has no neurologic deficits.  He may be developing a cataract causing impairment in vision.  Alternative diagnoses include macular degeneration, corneal abrasion, retinopathy.  No corneal uptake is identified with fluorescein  stain.  IOP's are 23 to 24 mmHg bilaterally.  No pupillary findings on slit-lamp exam.  Patient advised to follow-up with ophthalmology.  He was discharged in stable condition.     Final diagnoses:  Visual disturbance    ED Discharge Orders     None          Melvenia Motto, MD 12/14/23 1739

## 2023-12-15 DIAGNOSIS — H18413 Arcus senilis, bilateral: Secondary | ICD-10-CM | POA: Diagnosis not present

## 2023-12-15 DIAGNOSIS — H40013 Open angle with borderline findings, low risk, bilateral: Secondary | ICD-10-CM | POA: Diagnosis not present

## 2023-12-15 DIAGNOSIS — Z961 Presence of intraocular lens: Secondary | ICD-10-CM | POA: Diagnosis not present

## 2023-12-15 DIAGNOSIS — H04123 Dry eye syndrome of bilateral lacrimal glands: Secondary | ICD-10-CM | POA: Diagnosis not present

## 2023-12-19 ENCOUNTER — Encounter (HOSPITAL_COMMUNITY): Payer: Self-pay

## 2023-12-19 ENCOUNTER — Emergency Department (HOSPITAL_COMMUNITY)
Admission: EM | Admit: 2023-12-19 | Discharge: 2023-12-19 | Disposition: A | Discharge status: Home / Self Care | Attending: Emergency Medicine | Admitting: Emergency Medicine

## 2023-12-19 ENCOUNTER — Other Ambulatory Visit: Payer: Self-pay

## 2023-12-19 DIAGNOSIS — H5712 Ocular pain, left eye: Secondary | ICD-10-CM | POA: Diagnosis not present

## 2023-12-19 MED ORDER — ERYTHROMYCIN 5 MG/GM OP OINT: TOPICAL_OINTMENT | OPHTHALMIC | 0 refills | Status: AC

## 2023-12-19 NOTE — ED Triage Notes (Signed)
 Pt presents with ongoing blurred vision in his L eye that has been present for 2 weeks. Pt has had multiple visits to the ED for the same. He does not report any worsening or improvement to his symptoms. He has not made a follow up appointment with ophthalmology. He did not remember receiving his printed discharge summary from last visit that had the follow up number to call.

## 2023-12-19 NOTE — Discharge Instructions (Signed)
 You will need to use the erythromycin cream, you can take this every 6 hours onto the eyeball and the eyelid, you must see the eye doctor, I gave you the phone number for Dr. Tobie, please call Monday morning for an appointment.  If you have a different eye doctor that you would like to see you are more than welcome to see them as well.

## 2023-12-19 NOTE — ED Provider Notes (Signed)
 [place] EMERGENCY DEPARTMENT AT [facility] Provider Note   CSN: [id number] Arrival date & time: 12/19/23  1634     Patient presents with: Eye Problem   Daniel Mathis is a 89 y.o. male.    Eye Problem  This patient is a 89 year old male, he presents with a complaint of having some left eye discomfort, he states that it is difficult to explain what he is having but it is not pain, it is not related to significant changes in vision, there is some difficulty with changes in vision but he cannot tell me what it is, sometimes it is a little bit blurry, sometimes it is not, it is not as severe eye pain, he does have some discomfort with touching the upper and lower eyelids and they are sometimes red but this comes and goes.  No sensitivity to light, no headache, no vomiting or nausea.  He has already been here twice and had normal intraocular pressures which have been symmetrical on both sides, he has had his eye stained and there has been no signs of corneal abrasion on 2 different visits this last week, he has been told that he needs to see an eye doctor but has not yet called or seen an eye doctor.  The onset has been going on for couple of weeks, he cannot give me an exact time, denies swelling of the eyes or eyelids, drainage or discharge, he does not wear contact lenses or glasses    Prior to Admission medications   Medication Sig Start Date End Date Taking? Authorizing Provider  erythromycin ophthalmic ointment Place a 1/2 inch ribbon of ointment into the left eye / eyelid every 6 hours 12/19/23  Yes Cleotilde Rogue, MD  amLODipine (NORVASC) 2.5 MG tablet Take 1 tablet by mouth daily. 07/17/17   [provider]  aspirin  EC 81 MG tablet Take 1 tablet (81 mg total) by mouth daily. Swallow whole. 12/02/21   Shahmehdi, Seyed A, MD  hydrochlorothiazide (HYDRODIURIL) 25 MG tablet Take 25 mg by mouth daily. 11/25/21   [provider]  losartan (COZAAR) 100 MG  tablet Take 100 mg by mouth daily. 09/30/21   [provider]  pregabalin  (LYRICA ) 100 MG capsule Take 100 mg by mouth 3 (three) times daily. 11/12/21   [provider]    Allergies: Patient has no known allergies.    Review of Systems  All other systems reviewed and are negative.   Updated Vital Signs BP (!) 192/76 (BP Location: Left Arm)   Pulse 60   Temp 97.9 F (36.6 C) (Oral)   Resp 18   Ht 1.829 m (6')   Wt 90 kg   BMI 26.91 kg/m   Physical Exam Vitals and nursing note reviewed.  Constitutional:      Appearance: He is well-developed. He is not diaphoretic.  HENT:     Head: Normocephalic and atraumatic.  Eyes:     General:        Right eye: No discharge.        Left eye: No discharge.     Conjunctiva/sclera: Conjunctivae normal.     Comments: There is a mild erythema of the left upper eyelid, there is mild tenderness to this area.  There is no swelling of the periorbital tissues, no pain with extraocular movements, no discharge from the eyes, normal conjunctiva, normal pupils.  Pulmonary:     Effort: Pulmonary effort is normal. No respiratory distress.  Skin:  General: Skin is warm and dry.     Findings: No erythema or rash.  Neurological:     Mental Status: He is alert.     Coordination: Coordination normal.     (all labs ordered are listed, but only abnormal results are displayed) Labs Reviewed - No data to display  EKG: None  Radiology: No results found.   Procedures   Medications Ordered in the ED - No data to display                                  Medical Decision Making Risk Prescription drug management.   Posterior exam is difficult to detect on ophthalmology exam, he will need to be seen by an ophthalmologist, I do not think anything here is related to stroke, glaucoma, or other emergency.  Will prescribe erythromycin ointment in case there is a little blepharitis but I have referred him to ophthalmology and given my  phone number, he expressed his understanding to the need for close follow-up.     Final diagnoses:  Eye pain, left    ED Discharge Orders          Ordered    erythromycin ophthalmic ointment        12/19/23 1747               Cleotilde Rogue, MD 12/19/23 1751

## 2023-12-19 NOTE — ED Notes (Signed)
 Pt provided discharge instructions and prescription information. Pt was given the opportunity to ask questions and questions were answered.

## 2024-01-11 DIAGNOSIS — Z79899 Other long term (current) drug therapy: Secondary | ICD-10-CM | POA: Diagnosis not present

## 2024-01-11 DIAGNOSIS — I10 Essential (primary) hypertension: Secondary | ICD-10-CM | POA: Diagnosis not present

## 2024-01-11 DIAGNOSIS — G909 Disorder of the autonomic nervous system, unspecified: Secondary | ICD-10-CM | POA: Diagnosis not present

## 2024-01-11 DIAGNOSIS — E1140 Type 2 diabetes mellitus with diabetic neuropathy, unspecified: Secondary | ICD-10-CM | POA: Diagnosis not present

## 2024-01-28 ENCOUNTER — Emergency Department (HOSPITAL_COMMUNITY)
Admission: EM | Admit: 2024-01-28 | Discharge: 2024-01-28 | Disposition: A | Discharge status: Home / Self Care | Attending: Emergency Medicine | Admitting: Emergency Medicine

## 2024-01-28 ENCOUNTER — Encounter (HOSPITAL_COMMUNITY): Payer: Self-pay | Admitting: Emergency Medicine

## 2024-01-28 ENCOUNTER — Other Ambulatory Visit: Payer: Self-pay

## 2024-01-28 DIAGNOSIS — R21 Rash and other nonspecific skin eruption: Secondary | ICD-10-CM | POA: Diagnosis present

## 2024-01-28 DIAGNOSIS — Z7982 Long term (current) use of aspirin: Secondary | ICD-10-CM | POA: Insufficient documentation

## 2024-01-28 DIAGNOSIS — L249 Irritant contact dermatitis, unspecified cause: Secondary | ICD-10-CM | POA: Diagnosis not present

## 2024-01-28 MED ORDER — PREDNISONE 50 MG PO TABS: ORAL_TABLET | ORAL | 0 refills | Status: AC

## 2024-01-28 MED ORDER — PREDNISONE 50 MG PO TABS: 60.0000 mg | ORAL_TABLET | Freq: Every day | ORAL | Status: DC

## 2024-01-28 MED ORDER — CLOTRIMAZOLE 1 % EX CREA: 1.0000 | TOPICAL_CREAM | Freq: Two times a day (BID) | CUTANEOUS | 0 refills | Status: AC

## 2024-01-28 NOTE — Discharge Instructions (Addendum)
 Stop using deodorant.  Use only dove soap and water to wash underarms.

## 2024-01-28 NOTE — ED Notes (Signed)
Not in waiting area x 1 

## 2024-01-28 NOTE — ED Triage Notes (Signed)
 Pt c/o rash under both arms. Redness and raw skin noted, denies using different deo or soap. Nad. C/o burning

## 2024-01-28 NOTE — ED Provider Notes (Signed)
 " [place] EMERGENCY DEPARTMENT AT [facility] Provider Note   CSN: [id number] Arrival date & time: 01/28/24  1435     Patient presents with: Rash   Daniel Mathis is a 89 y.o. male.   Patient complains of redness and swelling to bilateral axilla.  Patient reports he has been using Benadryl cream without relief.  Patient denies using anything in the area other than deodorant.  Patient has not had any fever or chills he does not have any other area of redness.  Patient denies any cough or congestion.  He denies any new soaps new washing powders.  The history is provided by the patient. No language interpreter was used.  Rash Quality: itchiness and redness   Severity:  Moderate Onset quality:  Gradual Timing:  Constant Progression:  Worsening Chronicity:  New Relieved by:  Nothing Worsened by:  Nothing Ineffective treatments:  Antihistamines      Prior to Admission medications  Medication Sig Start Date End Date Taking? Authorizing Provider  clotrimazole  (LOTRIMIN  AF) 1 % cream Apply 1 Application topically 2 (two) times daily. 01/28/24  Yes Analilia Geddis K, PA-C  predniSONE  (DELTASONE ) 50 MG tablet One tablet a day for 5 days 01/28/24  Yes Deval Mroczka K, PA-C  amLODipine (NORVASC) 2.5 MG tablet Take 1 tablet by mouth daily. 07/17/17   [provider]  aspirin  EC 81 MG tablet Take 1 tablet (81 mg total) by mouth daily. Swallow whole. 12/02/21   Willette Adriana LABOR, MD  erythromycin  ophthalmic ointment Place a 1/2 inch ribbon of ointment into the left eye / eyelid every 6 hours 12/19/23   Cleotilde Rogue, MD  hydrochlorothiazide (HYDRODIURIL) 25 MG tablet Take 25 mg by mouth daily. 11/25/21   [provider]  losartan (COZAAR) 100 MG tablet Take 100 mg by mouth daily. 09/30/21   [provider]  pregabalin  (LYRICA ) 100 MG capsule Take 100 mg by mouth 3 (three) times daily. 11/12/21   [provider]    Allergies: Patient has no known  allergies.    Review of Systems  Skin:  Positive for rash.  All other systems reviewed and are negative.   Updated Vital Signs BP (!) 169/64 (BP Location: Right Arm)   Pulse 73   Temp 97.8 F (36.6 C) (Oral)   Resp 17   SpO2 100%   Physical Exam Vitals and nursing note reviewed.  Constitutional:      Appearance: He is well-developed.  HENT:     Head: Normocephalic.  Cardiovascular:     Rate and Rhythm: Normal rate.  Pulmonary:     Effort: Pulmonary effort is normal.  Abdominal:     General: There is no distension.  Musculoskeletal:        General: Normal range of motion.     Cervical back: Normal range of motion.  Skin:    General: Skin is warm.     Comments: Erythema bilateral axilla, no lymphadenopathy, tender to palpation,  Neurological:     General: No focal deficit present.     Mental Status: He is alert and oriented to person, place, and time.     (all labs ordered are listed, but only abnormal results are displayed) Labs Reviewed - No data to display  EKG: None  Radiology: No results found.   Procedures   Medications Ordered in the ED  predniSONE  (DELTASONE ) tablet 60 mg (has no administration in time range)  Medical Decision Making Patient complains of redness and swelling to bilateral armpits.  Patient denies using anything other than deodorant.  Risk Prescription drug management. Risk Details: Patient has erythematous areas bilateral axilla,  Patient is advised to stop using deodorant.  He is advised to use a hypoallergenic soap such as Dove to wash.  Patient is given prednisone  here and a prescription for prednisone .  Patient also given Lotrimin  cream to cover in case there is any type of fungal infection.  Patient is advised to return to the emergency department if symptoms worsen or change.        Final diagnoses:  Irritant contact dermatitis, unspecified trigger    ED Discharge Orders           Ordered    clotrimazole  (LOTRIMIN  AF) 1 % cream  2 times daily        01/28/24 1834    predniSONE  (DELTASONE ) 50 MG tablet        01/28/24 1835            An After Visit Summary was printed and given to the patient.    Flint Sonny POUR, PA-C 01/28/24 1939  "

## 2024-02-18 ENCOUNTER — Other Ambulatory Visit: Payer: Self-pay

## 2024-02-18 ENCOUNTER — Emergency Department (HOSPITAL_COMMUNITY)
Admission: EM | Admit: 2024-02-18 | Discharge: 2024-02-18 | Disposition: A | Discharge status: Home / Self Care | Attending: Emergency Medicine | Admitting: Emergency Medicine

## 2024-02-18 ENCOUNTER — Encounter (HOSPITAL_COMMUNITY): Payer: Self-pay | Admitting: Emergency Medicine

## 2024-02-18 DIAGNOSIS — I10 Essential (primary) hypertension: Secondary | ICD-10-CM | POA: Diagnosis not present

## 2024-02-18 DIAGNOSIS — H10501 Unspecified blepharoconjunctivitis, right eye: Secondary | ICD-10-CM | POA: Insufficient documentation

## 2024-02-18 DIAGNOSIS — Z79899 Other long term (current) drug therapy: Secondary | ICD-10-CM | POA: Diagnosis not present

## 2024-02-18 DIAGNOSIS — Z7982 Long term (current) use of aspirin: Secondary | ICD-10-CM | POA: Diagnosis not present

## 2024-02-18 DIAGNOSIS — H5789 Other specified disorders of eye and adnexa: Secondary | ICD-10-CM | POA: Diagnosis present

## 2024-02-18 MED ORDER — ERYTHROMYCIN 5 MG/GM OP OINT
TOPICAL_OINTMENT | Freq: Once | OPHTHALMIC | Status: AC
  Filled 2024-02-18: qty 3.5

## 2024-02-18 MED ORDER — FLUORESCEIN SODIUM 1 MG OP STRP
1.0000 | ORAL_STRIP | Freq: Once | OPHTHALMIC | Status: AC
  Administered 2024-02-18: 1 via OPHTHALMIC
  Filled 2024-02-18: qty 1

## 2024-02-18 MED ORDER — TETRACAINE HCL 0.5 % OP SOLN
2.0000 [drp] | Freq: Once | OPHTHALMIC | Status: AC
  Administered 2024-02-18: 2 [drp] via OPHTHALMIC
  Filled 2024-02-18: qty 4

## 2024-02-18 NOTE — Discharge Instructions (Addendum)
 You are seen in the emergency room today for right eye irritation, you likely have some inflammation of your conjunctive, we are prescribing antibiotics in case of any possible infection but there is no sign of a foreign body, abrasion or any other acute problem.  Follow-up with the eye doctor, come back to the ER if you have new or worsening symptoms-especially pain, vision changes, drainage.

## 2024-02-18 NOTE — ED Provider Notes (Signed)
 "  EMERGENCY DEPARTMENT AT [facility] Provider Note   CSN: [id number] Arrival date & time: 02/18/24  1025     Patient presents with: Foreign Body in [place] Daniel Mathis is a 89 y.o. male.  History of hypertension and neuropathy.  Presents to the ER today complaining of right eye irritation.  He states he noticed that yesterday gradually and the eye felt irritated, feels that there is something in the eye.  He is rubbed on it, denies any drainage or redness, he states it feels like there is a film over the eye.  He does not recall doing any activities that would have gotten any dust or debris in his eye, denies any pain to the eye.    Foreign Body in Eye       Prior to Admission medications  Medication Sig Start Date End Date Taking? Authorizing Provider  amLODipine (NORVASC) 2.5 MG tablet Take 1 tablet by mouth daily. 07/17/17   [provider]  aspirin  EC 81 MG tablet Take 1 tablet (81 mg total) by mouth daily. Swallow whole. 12/02/21   Shahmehdi, Adriana LABOR, MD  clotrimazole  (LOTRIMIN  AF) 1 % cream Apply 1 Application topically 2 (two) times daily. 01/28/24   Sofia, Leslie K, PA-C  erythromycin  ophthalmic ointment Place a 1/2 inch ribbon of ointment into the left eye / eyelid every 6 hours 12/19/23   Cleotilde Rogue, MD  hydrochlorothiazide (HYDRODIURIL) 25 MG tablet Take 25 mg by mouth daily. 11/25/21   [provider]  losartan (COZAAR) 100 MG tablet Take 100 mg by mouth daily. 09/30/21   [provider]  predniSONE  (DELTASONE ) 50 MG tablet One tablet a day for 5 days 01/28/24   Sofia, Leslie K, PA-C  pregabalin  (LYRICA ) 100 MG capsule Take 100 mg by mouth 3 (three) times daily. 11/12/21   [provider]    Allergies: Patient has no known allergies.    Review of Systems  Updated Vital Signs BP (!) 124/48   Pulse 66   Temp 97.9 F (36.6 C) (Oral)   Resp 16   Ht 6' (1.829 m)   Wt 90 kg   SpO2 92%   BMI 26.91 kg/m    Physical Exam Vitals and nursing note reviewed.  Constitutional:      General: He is not in acute distress.    Appearance: He is well-developed.  HENT:     Head: Normocephalic and atraumatic.     Nose: Nose normal.     Mouth/Throat:     Mouth: Mucous membranes are moist.  Eyes:     General: Lids are normal. No visual field deficit or scleral icterus.       Right eye: No foreign body, discharge or hordeolum.        Left eye: No discharge.     Extraocular Movements: Extraocular movements intact.     Right eye: Normal extraocular motion and no nystagmus.     Left eye: Normal extraocular motion and no nystagmus.     Conjunctiva/sclera: Conjunctivae normal.     Right eye: No chemosis, exudate or hemorrhage.    Pupils: Pupils are equal, round, and reactive to light.     Comments: There is mild conjunctival injection to medial conjunctivo-, no fluorescein  uptake, no foreign body  Cardiovascular:     Rate and Rhythm: Normal rate and regular rhythm.     Heart sounds: No murmur heard. Pulmonary:     Effort: Pulmonary effort is normal.  No respiratory distress.     Breath sounds: Normal breath sounds.  Abdominal:     Palpations: Abdomen is soft.     Tenderness: There is no abdominal tenderness.  Musculoskeletal:        General: No swelling.     Cervical back: Neck supple.  Skin:    General: Skin is warm and dry.     Capillary Refill: Capillary refill takes less than 2 seconds.  Neurological:     Mental Status: He is alert.  Psychiatric:        Mood and Affect: Mood normal.     (all labs ordered are listed, but only abnormal results are displayed) Labs Reviewed - No data to display  EKG: None  Radiology: No results found.   Procedures   Medications Ordered in the ED  erythromycin  ophthalmic ointment (has no administration in time range)  fluorescein  ophthalmic strip 1 strip (1 strip Right Eye Given by Other 02/18/24 1108)  tetracaine  (PONTOCAINE) 0.5 % ophthalmic  solution 2 drop (2 drops Right Eye Given by Other 02/18/24 1108)                                    Medical Decision Making Differential diagnose includes but not limited to corneal foreign body, corneal abrasion, conjunctivitis, iritis, glaucoma, scleritis, other  Course: Patient has some conjunctival irritation with foreign body sensation to the area with a medial canthus, no fluorescein  uptake, mild conjunctival injection on exam, there is no foreign body noted, vision in the right eye was noted by ED tech able to be 20/20, his unaffected eye on the left was noted to be 20/70, per record review he does have intermittent blurry vision in this left eye noted on prior visits.  He has no ocular pain or vision changes, pupils are equal round reactive to light, symptoms not consistent with glaucoma, he is not having ocular pain to suggest iritis, scleritis or any other serious pathology, discussed with patient he may have had a small foreign body causing some corneal irritation that came out with his own tears, will provide erythromycin  eye send advised on ophthalmology follow-up for more detailed eye exam.  He was advised  to use the erythromycin  ointment 4 times a day x 1 week.  Given strict return precautions.  Risk Prescription drug management.        Final diagnoses:  Blepharoconjunctivitis of right eye, unspecified blepharoconjunctivitis type    ED Discharge Orders     None          Suellen Sherran DELENA DEVONNA 02/18/24 1200    Garrick Charleston, MD 02/20/24 1543  "

## 2024-02-18 NOTE — ED Triage Notes (Signed)
 Pt c/o of debris to right eye.
